# Patient Record
Sex: Female | Born: 1965 | Race: White | Hispanic: No | Smoking: Current every day smoker
Health system: Southern US, Community
[De-identification: ages and names within clinical notes are randomized; demographics above are authoritative.]

## PROBLEM LIST (undated history)

## (undated) DIAGNOSIS — R519 Headache, unspecified: Secondary | ICD-10-CM

## (undated) DIAGNOSIS — R112 Nausea with vomiting, unspecified: Secondary | ICD-10-CM

## (undated) DIAGNOSIS — R51 Headache: Secondary | ICD-10-CM

## (undated) DIAGNOSIS — Z9889 Other specified postprocedural states: Secondary | ICD-10-CM

## (undated) DIAGNOSIS — N921 Excessive and frequent menstruation with irregular cycle: Secondary | ICD-10-CM

## (undated) DIAGNOSIS — T7840XA Allergy, unspecified, initial encounter: Secondary | ICD-10-CM

## (undated) DIAGNOSIS — D649 Anemia, unspecified: Secondary | ICD-10-CM

## (undated) HISTORY — DX: Anemia, unspecified: D64.9

## (undated) HISTORY — PX: ABDOMINAL HYSTERECTOMY: SHX81

## (undated) HISTORY — DX: Allergy, unspecified, initial encounter: T78.40XA

## (undated) HISTORY — PX: TUBAL LIGATION: SHX77

---

## 1998-02-01 ENCOUNTER — Other Ambulatory Visit: Admission: RE | Admit: 1998-02-01 | Discharge: 1998-02-01 | Payer: Self-pay | Admitting: Internal Medicine

## 1999-07-19 ENCOUNTER — Encounter (HOSPITAL_BASED_OUTPATIENT_CLINIC_OR_DEPARTMENT_OTHER): Payer: Self-pay | Admitting: Internal Medicine

## 1999-07-19 ENCOUNTER — Ambulatory Visit (HOSPITAL_COMMUNITY): Admission: RE | Admit: 1999-07-19 | Discharge: 1999-07-19 | Payer: Self-pay | Admitting: Internal Medicine

## 2001-08-29 ENCOUNTER — Emergency Department (HOSPITAL_COMMUNITY): Admission: EM | Admit: 2001-08-29 | Discharge: 2001-08-29 | Payer: Self-pay | Admitting: Emergency Medicine

## 2001-08-29 ENCOUNTER — Encounter: Payer: Self-pay | Admitting: Emergency Medicine

## 2001-09-10 ENCOUNTER — Encounter: Admission: RE | Admit: 2001-09-10 | Discharge: 2001-09-10 | Payer: Self-pay | Admitting: Orthopedic Surgery

## 2002-04-27 ENCOUNTER — Encounter: Admission: RE | Admit: 2002-04-27 | Discharge: 2002-04-27 | Payer: Self-pay | Admitting: Internal Medicine

## 2002-04-27 ENCOUNTER — Encounter (HOSPITAL_BASED_OUTPATIENT_CLINIC_OR_DEPARTMENT_OTHER): Payer: Self-pay | Admitting: Internal Medicine

## 2004-05-18 ENCOUNTER — Emergency Department (HOSPITAL_COMMUNITY): Admission: EM | Admit: 2004-05-18 | Discharge: 2004-05-18 | Payer: Self-pay | Admitting: Family Medicine

## 2005-03-15 ENCOUNTER — Emergency Department (HOSPITAL_COMMUNITY): Admission: EM | Admit: 2005-03-15 | Discharge: 2005-03-15 | Payer: Self-pay | Admitting: Family Medicine

## 2005-08-12 ENCOUNTER — Emergency Department (HOSPITAL_COMMUNITY): Admission: EM | Admit: 2005-08-12 | Discharge: 2005-08-12 | Payer: Self-pay | Admitting: Family Medicine

## 2009-04-26 ENCOUNTER — Inpatient Hospital Stay (HOSPITAL_COMMUNITY): Admission: AD | Admit: 2009-04-26 | Discharge: 2009-04-26 | Payer: Self-pay | Admitting: Family Medicine

## 2009-05-23 ENCOUNTER — Encounter: Payer: Self-pay | Admitting: Obstetrics & Gynecology

## 2009-05-23 ENCOUNTER — Ambulatory Visit: Payer: Self-pay | Admitting: Obstetrics and Gynecology

## 2009-05-23 LAB — CONVERTED CEMR LAB
Chlamydia, Swab/Urine, PCR: NEGATIVE
GC Probe Amp, Urine: NEGATIVE
TSH: 1.51 microintl units/mL (ref 0.350–4.500)

## 2009-06-06 ENCOUNTER — Ambulatory Visit: Payer: Self-pay | Admitting: Obstetrics and Gynecology

## 2009-06-20 ENCOUNTER — Ambulatory Visit (HOSPITAL_COMMUNITY): Admission: RE | Admit: 2009-06-20 | Discharge: 2009-06-20 | Payer: Self-pay | Admitting: Obstetrics and Gynecology

## 2009-06-29 ENCOUNTER — Ambulatory Visit: Payer: Self-pay | Admitting: Obstetrics & Gynecology

## 2010-02-05 ENCOUNTER — Emergency Department (HOSPITAL_COMMUNITY): Admission: EM | Admit: 2010-02-05 | Discharge: 2010-02-05 | Payer: Self-pay | Admitting: Family Medicine

## 2010-05-10 IMAGING — US US TRANSVAGINAL NON-OB
1 series · 14 of 25 positions shown · non-contrast
Comparison: 04/26/2009

CLINICAL DATA: Fibroids.  Follow-up complex left ovarian cystic
lesion.

TRANSVAGINAL ULTRASOUND OF PELVIS
TECHNIQUE: Transvaginal ultrasound examination of the pelvis was
performed including evaluation of the uterus, ovaries, adnexal
regions, and pelvic cul-de-sac.

[Series 1: us transvaginal non-ob · 32 acquisitions, 14 frames shown]
[im 1/32]
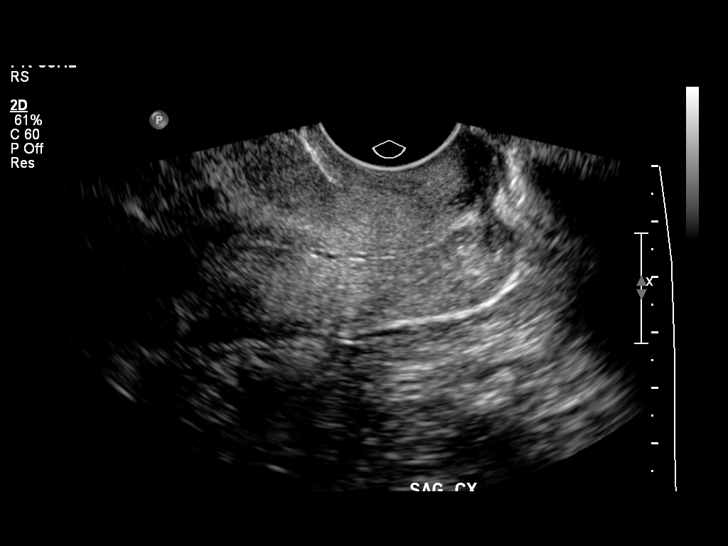
[im 3/32]
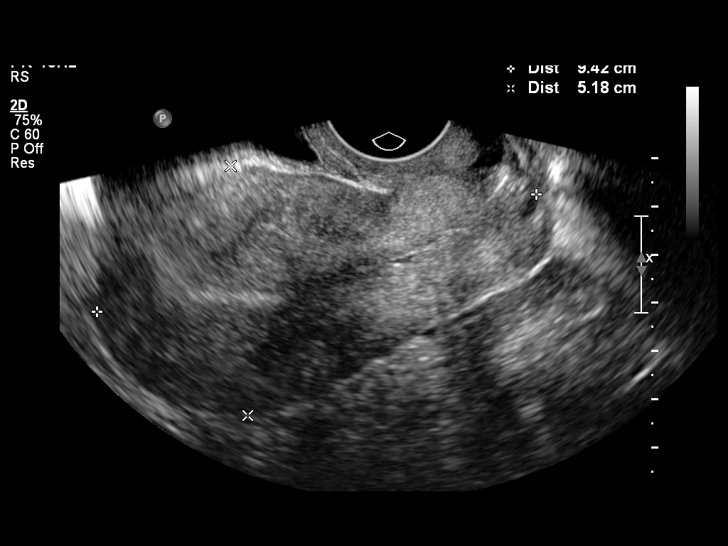
[im 6/32]
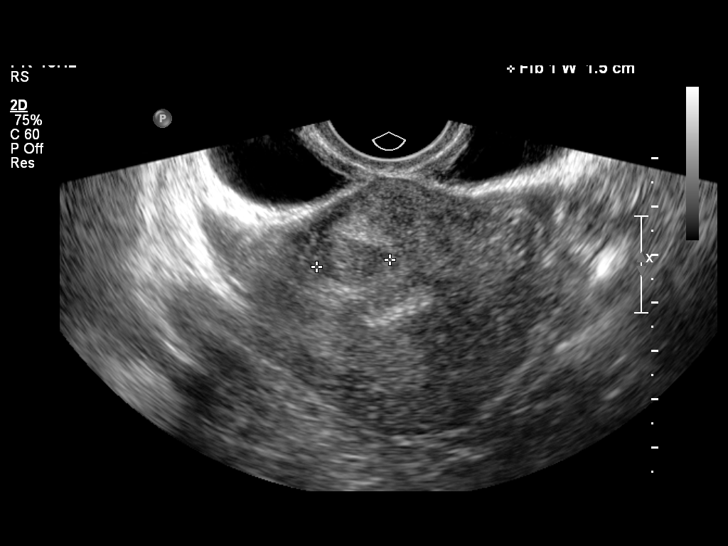
[im 8/32]
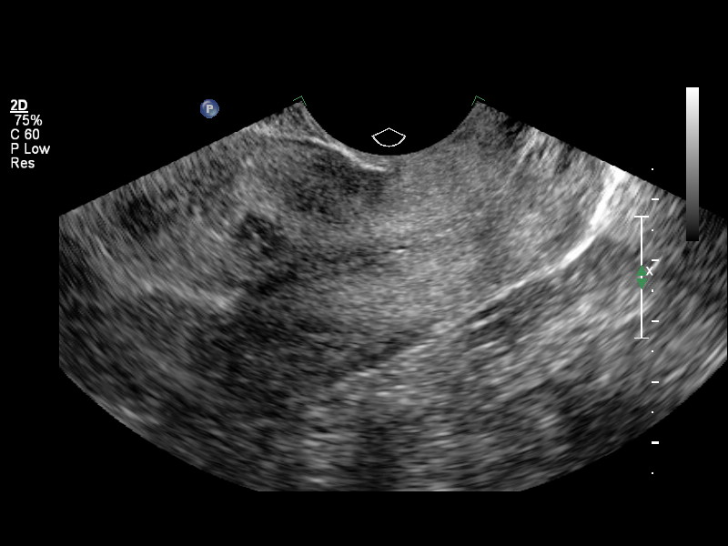
[im 11/32]
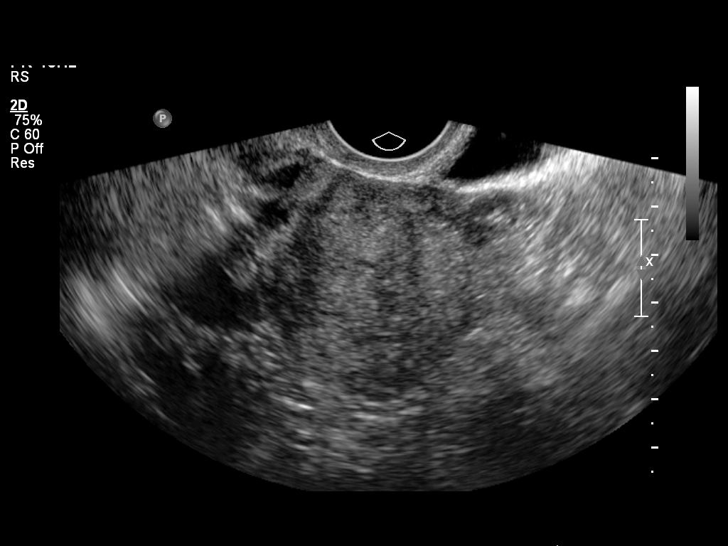
[im 12/32]
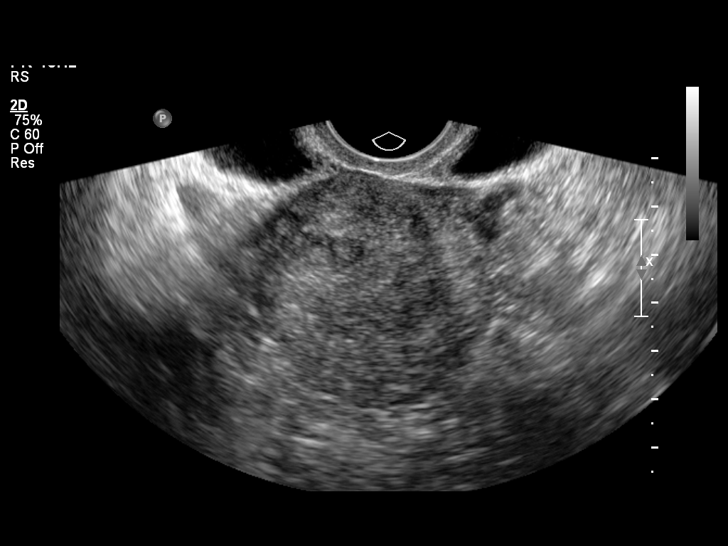
[im 15/32]
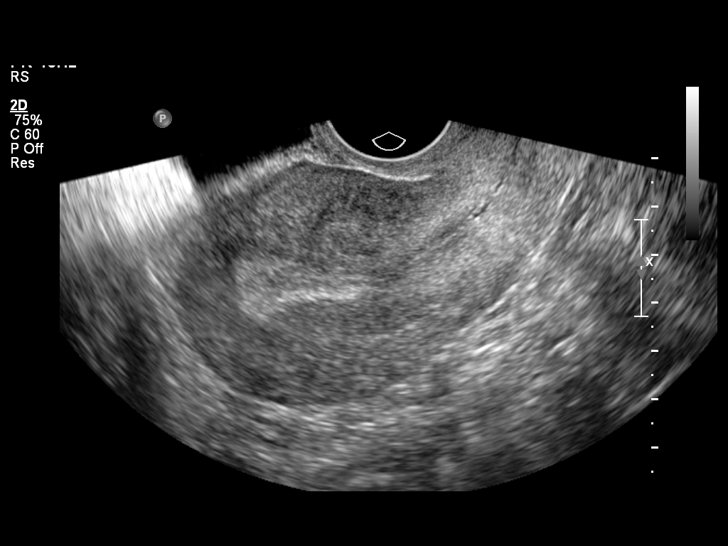
[im 17/32]
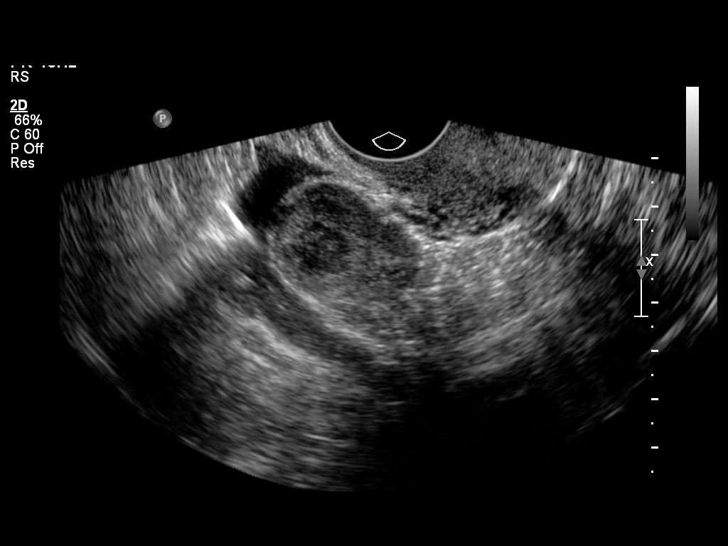
[im 20/32]
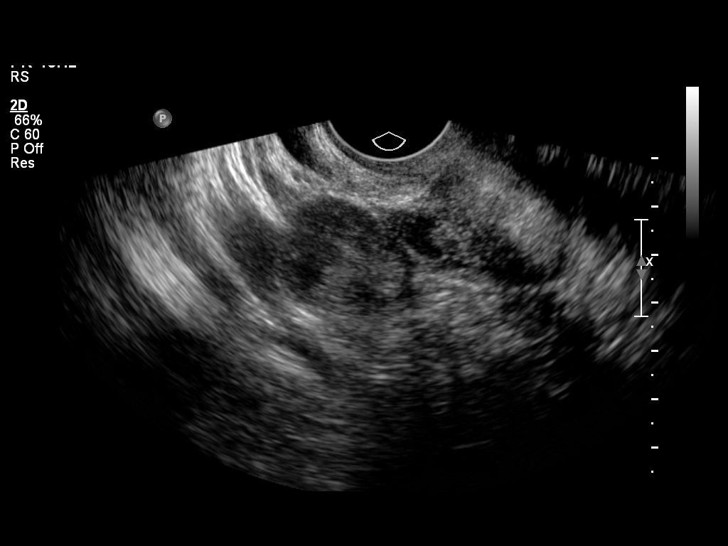
[im 21/32]
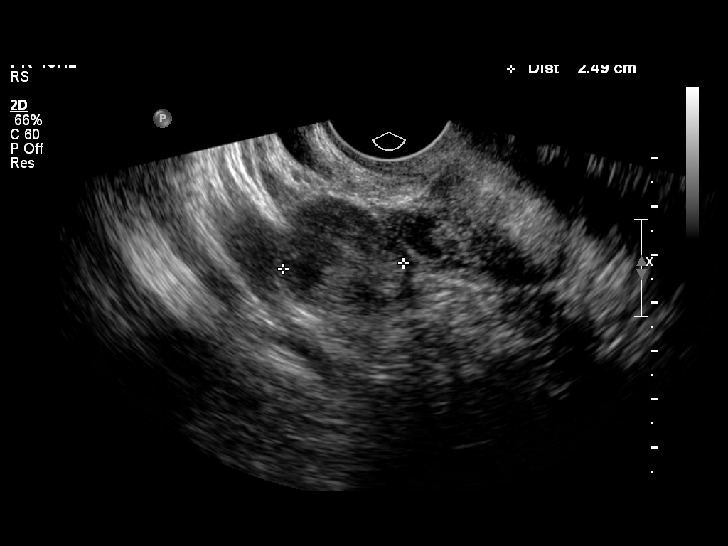
[im 24/32]
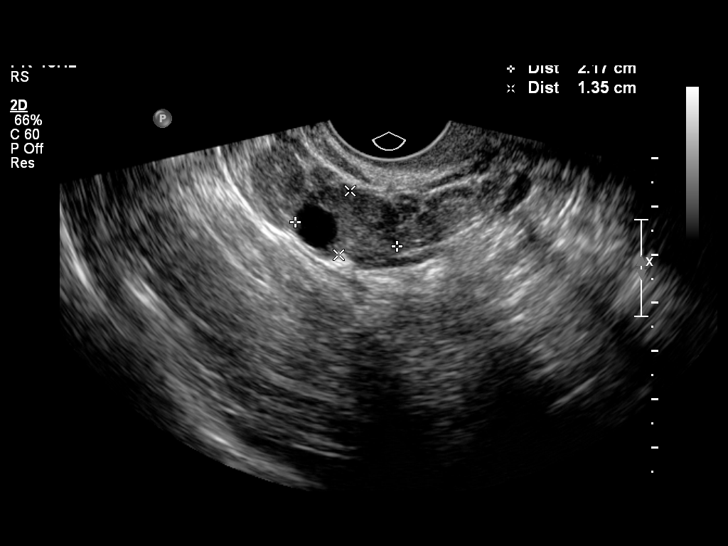
[im 26/32]
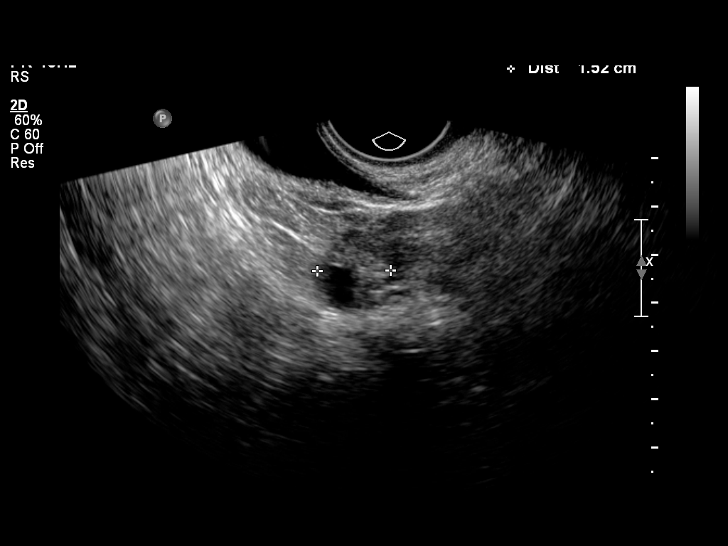
[im 29/32]
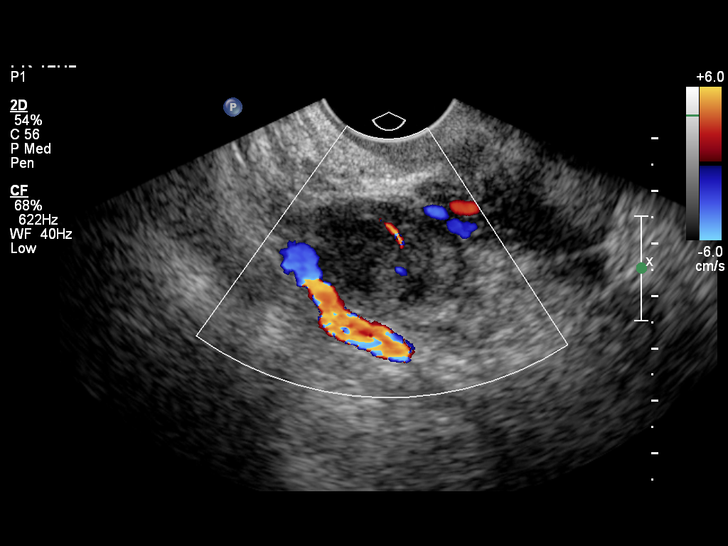
[im 32/32]
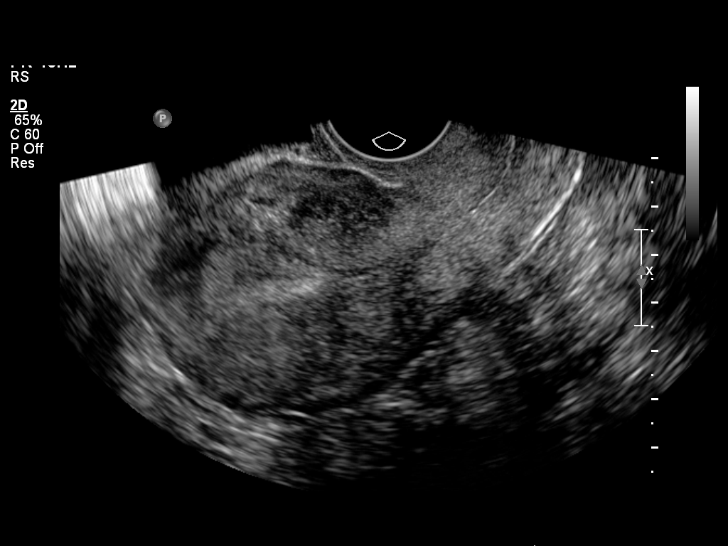

[14 of 25 positions shown; findings below may reference images not displayed]

FINDINGS: Uterus measures 9.4 x 5.2 x 5.7 cm.  Small fibroid is again seen
in the right anterior uterine body measuring 2 cm in maximum
diameter.

Endometrium measures 9 mm in thickness.  Within normal limits in
appearance.

Right Ovary measures 3.1 x 3.2 x 2.5 cm.  Normal appearance.

Left Ovary measures 2.2 x 1.4 x 1.5 cm.  Normal appearance.
Previously seen hemorrhagic cyst has resolved since prior study.

Other Findings:  No adnexal mass or free fluid identified.
IMPRESSION: 1.  Resolution of left ovarian hemorrhagic cyst since prior study.
2.  Stable 2 cm right anterior uterine fibroid.

## 2010-05-31 ENCOUNTER — Ambulatory Visit: Payer: Self-pay | Admitting: Advanced Practice Midwife

## 2010-06-04 ENCOUNTER — Ambulatory Visit (HOSPITAL_COMMUNITY)
Admission: RE | Admit: 2010-06-04 | Discharge: 2010-06-04 | Payer: Self-pay | Source: Home / Self Care | Admitting: Family Medicine

## 2010-06-21 ENCOUNTER — Ambulatory Visit: Payer: Self-pay | Admitting: Obstetrics & Gynecology

## 2010-10-17 LAB — CBC: Platelets: 222 10*3/uL (ref 150–400)

## 2010-10-17 LAB — WET PREP, GENITAL
Trich, Wet Prep: NONE SEEN
Yeast Wet Prep HPF POC: NONE SEEN

## 2010-10-17 LAB — HCG, SERUM, QUALITATIVE: Preg, Serum: NEGATIVE

## 2010-12-17 ENCOUNTER — Other Ambulatory Visit: Payer: Self-pay | Admitting: Obstetrics & Gynecology

## 2010-12-17 DIAGNOSIS — Z1231 Encounter for screening mammogram for malignant neoplasm of breast: Secondary | ICD-10-CM

## 2010-12-18 ENCOUNTER — Ambulatory Visit (HOSPITAL_COMMUNITY): Payer: Self-pay

## 2010-12-30 ENCOUNTER — Ambulatory Visit (HOSPITAL_COMMUNITY)
Admission: RE | Admit: 2010-12-30 | Discharge: 2010-12-30 | Disposition: A | Discharge status: Home / Self Care | Payer: Self-pay | Source: Ambulatory Visit | Attending: Obstetrics & Gynecology | Admitting: Obstetrics & Gynecology

## 2010-12-30 DIAGNOSIS — Z1231 Encounter for screening mammogram for malignant neoplasm of breast: Secondary | ICD-10-CM | POA: Insufficient documentation

## 2012-11-11 ENCOUNTER — Encounter: Payer: BC Managed Care – PPO | Admitting: Family Medicine

## 2013-02-02 NOTE — Progress Notes (Signed)
This encounter was created in error - please disregard.

## 2013-03-19 ENCOUNTER — Ambulatory Visit (INDEPENDENT_AMBULATORY_CARE_PROVIDER_SITE_OTHER): Payer: BC Managed Care – PPO | Admitting: Family Medicine

## 2013-03-19 VITALS — BP 112/72 | HR 69 | Temp 98.1°F | Resp 18 | Ht 65.0 in | Wt 142.0 lb

## 2013-03-19 DIAGNOSIS — L049 Acute lymphadenitis, unspecified: Secondary | ICD-10-CM

## 2013-03-19 MED ORDER — DOXYCYCLINE HYCLATE 100 MG PO CAPS: 100.0000 mg | ORAL_CAPSULE | Freq: Two times a day (BID) | ORAL | Status: DC

## 2013-03-19 NOTE — Progress Notes (Signed)
  Subjective:    Patient ID: Veronica Day, female    DOB: 29-Apr-1966, 47 y.o.   MRN: [id number]   Chief Complaint  Patient presents with  . swollen lymphnode left axilla    x 24 hours   HPI   Woke up with a painful lump under left arm, red but not draining anything.  Very tender. Nothing like this prior.  Past Medical History  Diagnosis Date  . Allergy   . Anemia    Current Outpatient Prescriptions on File Prior to Visit  Medication Sig Dispense Refill  . diphenhydrAMINE (BENADRYL) 25 MG tablet Take 25 mg by mouth every 6 (six) hours as needed for itching.      . Multiple Vitamins-Minerals (MULTIVITAMIN WITH MINERALS) tablet Take 1 tablet by mouth daily.       No current facility-administered medications on file prior to visit.   Allergies  Allergen Reactions  . Codeine Nausea And Vomiting   Review of Systems  Constitutional: Negative for fever, chills, diaphoresis and activity change.  Musculoskeletal: Positive for myalgias. Negative for joint swelling and gait problem.  Skin: Positive for color change. Negative for pallor and wound.  Hematological: Positive for adenopathy. Does not bruise/bleed easily.  Psychiatric/Behavioral: Negative for sleep disturbance.      BP 112/72  Pulse 69  Temp(Src) 98.1 F (36.7 C) (Oral)  Resp 18  Ht 5\' 5"  (1.651 m)  Wt 142 lb (64.411 kg)  BMI 23.63 kg/m2  SpO2 98%  LMP 03/05/2013 Objective:   Physical Exam  Nursing note reviewed. Constitutional: She is oriented to person, place, and time. She appears well-developed and well-nourished. No distress.  HENT:  Head: Normocephalic and atraumatic.  Right Ear: External ear normal.  Eyes: Conjunctivae are normal. No scleral icterus.  Pulmonary/Chest: Effort normal.  Lymphadenopathy:    She has no cervical adenopathy.    She has axillary adenopathy.       Right axillary: No pectoral and no lateral adenopathy present.       Left axillary: Pectoral adenopathy present. No lateral  adenopathy present.      Right: No supraclavicular adenopathy present.       Left: No supraclavicular adenopathy present.  1 cm erythematous tender indurated nodule with tiny central area of fluctuance in center of left axilla  Neurological: She is alert and oriented to person, place, and time.  Skin: Skin is warm and dry. She is not diaphoretic. No erythema.  Psychiatric: She has a normal mood and affect. Her behavior is normal.      Assessment & Plan:  Lymphadenitis, acute Unsure if swollen lymph node vs developing boil or infected sebacous cyst - but not big enough to drain at this time.  Use warm compresses freq and start doxy. If continues to enlarge, RTC for drainage. Meds ordered this encounter  Medications  . doxycycline (VIBRAMYCIN) 100 MG capsule    Sig: Take 1 capsule (100 mg total) by mouth 2 (two) times daily.    Dispense:  20 capsule    Refill:  0

## 2013-04-22 ENCOUNTER — Other Ambulatory Visit: Payer: Self-pay | Admitting: Obstetrics & Gynecology

## 2013-04-27 ENCOUNTER — Encounter (HOSPITAL_COMMUNITY): Payer: Self-pay | Admitting: Pharmacist

## 2013-05-11 ENCOUNTER — Encounter (HOSPITAL_COMMUNITY)
Admission: RE | Admit: 2013-05-11 | Discharge: 2013-05-11 | Disposition: A | Discharge status: Home / Self Care | Payer: BC Managed Care – PPO | Source: Ambulatory Visit | Attending: Obstetrics & Gynecology | Admitting: Obstetrics & Gynecology

## 2013-05-11 ENCOUNTER — Encounter (INDEPENDENT_AMBULATORY_CARE_PROVIDER_SITE_OTHER): Payer: Self-pay

## 2013-05-11 ENCOUNTER — Encounter (HOSPITAL_COMMUNITY): Payer: Self-pay

## 2013-05-11 DIAGNOSIS — N949 Unspecified condition associated with female genital organs and menstrual cycle: Secondary | ICD-10-CM | POA: Diagnosis present

## 2013-05-11 DIAGNOSIS — N8300 Follicular cyst of ovary, unspecified side: Secondary | ICD-10-CM | POA: Diagnosis not present

## 2013-05-11 LAB — CBC
MCH: 31.3 pg (ref 26.0–34.0)
Platelets: 194 10*3/uL (ref 150–400)
RBC: 4.16 MIL/uL (ref 3.87–5.11)

## 2013-05-11 MED ORDER — LACTATED RINGERS IV SOLN: INTRAVENOUS | Status: DC

## 2013-05-11 NOTE — Patient Instructions (Signed)
Your procedure is scheduled on:05/12/13  Enter through the Main Entrance at :6am Pick up desk phone and dial [phone] and inform us of your arrival.  Please call [phone] if you have any problems the morning of surgery.  Remember: Do not eat food or drink liquids, including water, after midnight:tonight   You may brush your teeth the morning of surgery.   DO NOT wear jewelry, eye make-up, lipstick,body lotion, or dark fingernail polish.  (Polished toes are ok) You may wear deodorant.   Patients discharged on the day of surgery will not be allowed to drive home. Wear loose fitting, comfortable clothes for your ride home.

## 2013-05-12 ENCOUNTER — Ambulatory Visit (HOSPITAL_COMMUNITY)
Admission: RE | Admit: 2013-05-12 | Discharge: 2013-05-12 | Disposition: A | Discharge status: Home / Self Care | Payer: BC Managed Care – PPO | Source: Ambulatory Visit | Attending: Obstetrics & Gynecology | Admitting: Obstetrics & Gynecology

## 2013-05-12 ENCOUNTER — Encounter (HOSPITAL_COMMUNITY): Payer: Self-pay | Admitting: Certified Registered"

## 2013-05-12 ENCOUNTER — Encounter (HOSPITAL_COMMUNITY): Payer: BC Managed Care – PPO | Admitting: Anesthesiology

## 2013-05-12 ENCOUNTER — Ambulatory Visit (HOSPITAL_COMMUNITY): Payer: BC Managed Care – PPO | Admitting: Anesthesiology

## 2013-05-12 ENCOUNTER — Encounter (HOSPITAL_COMMUNITY)
Admission: RE | Disposition: A | Discharge status: Home / Self Care | Payer: Self-pay | Source: Ambulatory Visit | Attending: Obstetrics & Gynecology

## 2013-05-12 DIAGNOSIS — N8300 Follicular cyst of ovary, unspecified side: Secondary | ICD-10-CM | POA: Insufficient documentation

## 2013-05-12 DIAGNOSIS — N949 Unspecified condition associated with female genital organs and menstrual cycle: Secondary | ICD-10-CM | POA: Diagnosis not present

## 2013-05-12 HISTORY — PX: LAPAROSCOPY: SHX197

## 2013-05-12 LAB — PREGNANCY, URINE: Preg Test, Ur: NEGATIVE

## 2013-05-12 SURGERY — LAPAROSCOPY, DIAGNOSTIC
Anesthesia: General | Site: Abdomen | Wound class: Clean

## 2013-05-12 MED ORDER — KETOROLAC TROMETHAMINE 30 MG/ML IJ SOLN
INTRAMUSCULAR | Status: AC
  Filled 2013-05-12: qty 1

## 2013-05-12 MED ORDER — NEOSTIGMINE METHYLSULFATE 1 MG/ML IJ SOLN
INTRAMUSCULAR | Status: DC | PRN
  Administered 2013-05-12: 3 mg via INTRAVENOUS

## 2013-05-12 MED ORDER — MIDAZOLAM HCL 2 MG/2ML IJ SOLN
INTRAMUSCULAR | Status: AC
  Filled 2013-05-12: qty 2

## 2013-05-12 MED ORDER — BUPIVACAINE HCL (PF) 0.25 % IJ SOLN
INTRAMUSCULAR | Status: DC | PRN
  Administered 2013-05-12: 10 mL

## 2013-05-12 MED ORDER — SILVER NITRATE-POT NITRATE 75-25 % EX MISC
CUTANEOUS | Status: AC
  Filled 2013-05-12: qty 1

## 2013-05-12 MED ORDER — LIDOCAINE HCL (CARDIAC) 20 MG/ML IV SOLN
INTRAVENOUS | Status: AC
  Filled 2013-05-12: qty 5

## 2013-05-12 MED ORDER — DEXAMETHASONE SODIUM PHOSPHATE 10 MG/ML IJ SOLN
INTRAMUSCULAR | Status: DC | PRN
  Administered 2013-05-12: 10 mg via INTRAVENOUS

## 2013-05-12 MED ORDER — PROPOFOL 10 MG/ML IV BOLUS
INTRAVENOUS | Status: DC | PRN
  Administered 2013-05-12: 150 mg via INTRAVENOUS
  Administered 2013-05-12: 30 mg via INTRAVENOUS
  Administered 2013-05-12: 20 mg via INTRAVENOUS

## 2013-05-12 MED ORDER — FENTANYL CITRATE 0.05 MG/ML IJ SOLN
INTRAMUSCULAR | Status: DC | PRN
  Administered 2013-05-12 (×4): 50 ug via INTRAVENOUS

## 2013-05-12 MED ORDER — FENTANYL CITRATE 0.05 MG/ML IJ SOLN
INTRAMUSCULAR | Status: AC
  Administered 2013-05-12: 50 ug via INTRAVENOUS
  Filled 2013-05-12: qty 2

## 2013-05-12 MED ORDER — ROCURONIUM BROMIDE 100 MG/10ML IV SOLN
INTRAVENOUS | Status: DC | PRN
  Administered 2013-05-12: 30 mg via INTRAVENOUS

## 2013-05-12 MED ORDER — LACTATED RINGERS IV SOLN
INTRAVENOUS | Status: DC
  Administered 2013-05-12 (×2): via INTRAVENOUS

## 2013-05-12 MED ORDER — DEXAMETHASONE SODIUM PHOSPHATE 10 MG/ML IJ SOLN
INTRAMUSCULAR | Status: AC
  Filled 2013-05-12: qty 1

## 2013-05-12 MED ORDER — METHYLENE BLUE 1 % INJ SOLN
INTRAMUSCULAR | Status: AC
  Filled 2013-05-12: qty 10

## 2013-05-12 MED ORDER — GLYCOPYRROLATE 0.2 MG/ML IJ SOLN
INTRAMUSCULAR | Status: DC | PRN
  Administered 2013-05-12: .5 mg via INTRAVENOUS

## 2013-05-12 MED ORDER — ONDANSETRON HCL 4 MG/2ML IJ SOLN
INTRAMUSCULAR | Status: DC | PRN
  Administered 2013-05-12: 4 mg via INTRAVENOUS

## 2013-05-12 MED ORDER — CEFAZOLIN SODIUM-DEXTROSE 2-3 GM-% IV SOLR
INTRAVENOUS | Status: AC
  Filled 2013-05-12: qty 50

## 2013-05-12 MED ORDER — BUPIVACAINE HCL (PF) 0.25 % IJ SOLN
INTRAMUSCULAR | Status: AC
  Filled 2013-05-12: qty 30

## 2013-05-12 MED ORDER — ONDANSETRON HCL 4 MG/2ML IJ SOLN
INTRAMUSCULAR | Status: AC
  Filled 2013-05-12: qty 2

## 2013-05-12 MED ORDER — PROPOFOL 10 MG/ML IV EMUL
INTRAVENOUS | Status: AC
  Filled 2013-05-12: qty 20

## 2013-05-12 MED ORDER — PROMETHAZINE HCL 25 MG/ML IJ SOLN
INTRAMUSCULAR | Status: AC
  Administered 2013-05-12: 6.25 mg
  Filled 2013-05-12: qty 1

## 2013-05-12 MED ORDER — HEPARIN SODIUM (PORCINE) 5000 UNIT/ML IJ SOLN
INTRAMUSCULAR | Status: AC
  Filled 2013-05-12: qty 1

## 2013-05-12 MED ORDER — MIDAZOLAM HCL 2 MG/2ML IJ SOLN
INTRAMUSCULAR | Status: DC | PRN
  Administered 2013-05-12: 2 mg via INTRAVENOUS

## 2013-05-12 MED ORDER — PROMETHAZINE HCL 25 MG PO TABS: 25.0000 mg | ORAL_TABLET | Freq: Four times a day (QID) | ORAL | Status: DC | PRN

## 2013-05-12 MED ORDER — LIDOCAINE HCL (CARDIAC) 20 MG/ML IV SOLN
INTRAVENOUS | Status: DC | PRN
  Administered 2013-05-12: 80 mg via INTRAVENOUS

## 2013-05-12 MED ORDER — FENTANYL CITRATE 0.05 MG/ML IJ SOLN
INTRAMUSCULAR | Status: AC
  Administered 2013-05-12: 25 ug via INTRAVENOUS
  Filled 2013-05-12: qty 2

## 2013-05-12 MED ORDER — FENTANYL CITRATE 0.05 MG/ML IJ SOLN
INTRAMUSCULAR | Status: AC
  Filled 2013-05-12: qty 5

## 2013-05-12 MED ORDER — CEFAZOLIN SODIUM-DEXTROSE 2-3 GM-% IV SOLR
INTRAVENOUS | Status: DC | PRN
  Administered 2013-05-12: 2 g via INTRAVENOUS

## 2013-05-12 MED ORDER — KETOROLAC TROMETHAMINE 30 MG/ML IJ SOLN
INTRAMUSCULAR | Status: DC | PRN
  Administered 2013-05-12: 30 mg via INTRAVENOUS

## 2013-05-12 MED ORDER — NEOSTIGMINE METHYLSULFATE 1 MG/ML IJ SOLN
INTRAMUSCULAR | Status: AC
  Filled 2013-05-12: qty 1

## 2013-05-12 MED ORDER — ROCURONIUM BROMIDE 100 MG/10ML IV SOLN
INTRAVENOUS | Status: AC
  Filled 2013-05-12: qty 1

## 2013-05-12 MED ORDER — GLYCOPYRROLATE 0.2 MG/ML IJ SOLN
INTRAMUSCULAR | Status: AC
  Filled 2013-05-12: qty 3

## 2013-05-12 MED ORDER — HYDROCODONE-IBUPROFEN 5-200 MG PO TABS: 1.0000 | ORAL_TABLET | Freq: Three times a day (TID) | ORAL | Status: DC | PRN

## 2013-05-12 MED ORDER — FENTANYL CITRATE 0.05 MG/ML IJ SOLN
25.0000 ug | INTRAMUSCULAR | Status: DC | PRN
  Administered 2013-05-12: 50 ug via INTRAVENOUS
  Administered 2013-05-12: 25 ug via INTRAVENOUS
  Administered 2013-05-12: 50 ug via INTRAVENOUS
  Administered 2013-05-12: 25 ug via INTRAVENOUS

## 2013-05-12 SURGICAL SUPPLY — 27 items
ADH SKN CLS APL DERMABOND .7 (GAUZE/BANDAGES/DRESSINGS) ×1
APPLICATOR COTTON TIP 6IN STRL (MISCELLANEOUS) ×2 IMPLANT
CABLE HIGH FREQUENCY MONO STRZ (ELECTRODE) IMPLANT
CATH ROBINSON RED A/P 16FR (CATHETERS) ×1 IMPLANT
CLOTH BEACON ORANGE TIMEOUT ST (SAFETY) ×2 IMPLANT
DERMABOND ADVANCED (GAUZE/BANDAGES/DRESSINGS) ×1
DERMABOND ADVANCED .7 DNX12 (GAUZE/BANDAGES/DRESSINGS) ×1 IMPLANT
GLOVE BIO SURGEON STRL SZ 6.5 (GLOVE) ×2 IMPLANT
GLOVE BIOGEL PI IND STRL 7.0 (GLOVE) ×1 IMPLANT
GLOVE BIOGEL PI INDICATOR 7.0 (GLOVE) ×1
GOWN PREVENTION PLUS LG XLONG (DISPOSABLE) ×6 IMPLANT
IV STOPCOCK 4 WAY 40  W/Y SET (IV SOLUTION)
IV STOPCOCK 4 WAY 40 W/Y SET (IV SOLUTION) IMPLANT
MANIPULATOR UTERINE 4.5 ZUMI (MISCELLANEOUS) IMPLANT
PACK LAPAROSCOPY BASIN (CUSTOM PROCEDURE TRAY) ×2 IMPLANT
PENCIL BUTTON HOLSTER BLD 10FT (ELECTRODE) ×1 IMPLANT
PROTECTOR NERVE ULNAR (MISCELLANEOUS) ×2 IMPLANT
SET IRRIG TUBING LAPAROSCOPIC (IRRIGATION / IRRIGATOR) IMPLANT
SUT MNCRL AB 4-0 PS2 18 (SUTURE) ×4 IMPLANT
SUT VICRYL 0 UR6 27IN ABS (SUTURE) ×4 IMPLANT
TOWEL OR 17X24 6PK STRL BLUE (TOWEL DISPOSABLE) ×4 IMPLANT
TRAY FOLEY CATH 14FR (SET/KITS/TRAYS/PACK) ×2 IMPLANT
TROCAR BALLN 12MMX100 BLUNT (TROCAR) ×2 IMPLANT
TROCAR XCEL NON-BLD 11X100MML (ENDOMECHANICALS) IMPLANT
TROCAR XCEL NON-BLD 5MMX100MML (ENDOMECHANICALS) ×2 IMPLANT
TROCAR XCEL OPT SLVE 5M 100M (ENDOMECHANICALS) IMPLANT
WATER STERILE IRR 1000ML POUR (IV SOLUTION) ×3 IMPLANT

## 2013-05-12 NOTE — H&P (Signed)
Veronica Day is an 47 y.o. female G49P3A2  RP:  Persistent pelvic pain for Dx LPS  Pertinent Gynecological History: Menses: flow is moderate Contraception: BT/S Blood transfusions: none Sexually transmitted diseases: no past history Previous GYN Procedures: BT/S  Last mammogram: normal Last pap: normal  OB History:  G5P3A2   Menstrual History:  No LMP recorded.    Past Medical History  Diagnosis Date  . Allergy   . Anemia     h/o with pregnancy    Past Surgical History  Procedure Laterality Date  . Tubal ligation      Family History  Problem Relation Age of Onset  . Thyroid disease Mother   . Heart disease Father   . Thyroid disease Sister   . Hypertension Brother     Social History:  reports that she has been smoking Cigarettes.  She has been smoking about 1.00 pack per day. She uses smokeless tobacco. She reports that she drinks alcohol. She reports that she does not use illicit drugs.  Allergies:  Allergies  Allergen Reactions  . Codeine Nausea And Vomiting    Prescriptions prior to admission  Medication Sig Dispense Refill  . acetaminophen (TYLENOL) 500 MG tablet Take 500 mg by mouth 2 (two) times daily as needed for pain.      . Biotin 1000 MCG tablet Take 1,000 mcg by mouth daily.      . Cholecalciferol 10000 UNITS CAPS Take 1 capsule by mouth daily.      . diphenhydrAMINE (BENADRYL) 25 MG tablet Take 25 mg by mouth daily as needed for allergies.       . Multiple Vitamins-Minerals (MULTIVITAMIN WITH MINERALS) tablet Take 1 tablet by mouth daily.        ROS  Blood pressure 106/85, pulse 79, temperature 98.2 F (36.8 C), temperature source Oral, resp. rate 18, SpO2 98.00%. 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  Assessment/Plan: Persistent pelvic pain for Dx LPS.  Surgery and risks reviewed.  Rashod Gougeon,MARIE-LYNE 05/12/2013, 7:19 AM

## 2013-05-12 NOTE — Anesthesia Postprocedure Evaluation (Signed)
Anesthesia Post Note  Patient: Veronica Day  Procedure(s) Performed: Procedure(s) (LRB): LAPAROSCOPY DIAGNOSTIC, BIOPSY OF PERITONEUM, DRAINAGE OF RIGHT FOLLICULAR CYST (N/A)  Anesthesia type: General  Patient location: PACU  Post pain: Pain level controlled  Post assessment: Post-op Vital signs reviewed  Last Vitals:  Filed Vitals:   05/12/13 0909  BP:   Pulse: 47  Temp:   Resp: 12    Post vital signs: Reviewed  Level of consciousness: sedated  Complications: No apparent anesthesia complications

## 2013-05-12 NOTE — Op Note (Signed)
05/12/2013  8:31 AM  PATIENT:  Veronica Day  47 y.o. female  PRE-OPERATIVE DIAGNOSIS:  Persistent Pelvic Pain  [address]  POST-OPERATIVE DIAGNOSIS:  Persistent Pelvic Pain  PROCEDURE:  Procedure(s): LAPAROSCOPY DIAGNOSTIC, BIOPSY OF PERITONEUM, DRAINAGE OF RIGHT FOLLICULAR CYST  SURGEON:  Surgeon(s): Genia Del, MD  ASSISTANTS: none   ANESTHESIA:   general  PROCEDURE:  Under general anesthesia with endotracheal intubation the patient is in lithotomy position. She is prepped with ChloraPrep on the abdomen and with Betadine on the suprapubic, vulvar and vaginal areas. She is draped as usual. The Foley is inserted in the bladder. The speculum is inserted in the vagina and the uterus was cannulated with a 1 tooth cannula. The tenaculum was removed and the speculum is removed. We go to the abdomen we infiltrate the subcutaneous tissue at the infraumbilically is with Marcaine one quarter plain 8 cc. We make and a 1.5 cm incision with a scalpel. On the aponeurosis is in opened with Mayo scissors under direct vision and the peritoneum is opened bluntly with the finger. A pursestring stitch of Vicryl 0 is done on the aponeurosis. The Roseanne Reno is inserted at that level and a pneumoperitoneum is created with CO2. The camera is inserted at that level. A the 5 mm incision is done at the left pelvic area after infiltrating Marcaine one quarter plain 2 cc. We insert the 5 mm trochars under direct vision. We explored the abdominopelvic cavities. The abdomen is normal with a normal liver and normal appendix. Pictures are taken. 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 Both tubes are status post tubal sterilization. The left ovary is normal to inspection in size and appearance. The right ovary is normal in size and appearance with a small follicular cyst. No lesion of endometriosis is seen in the pelvis on the anterior and posterior  cul-de-sac as well as the ovarian fossae are well the inspected. Pictures are taken of all the pelvic anatomy. We note a small retraction of the peritoneum at the right uterosacral ligament on the external side no lesion of endometriosis is seen side deep in that retraction. Nonetheless a biopsy of the peritoneum is taken at that location. We also drain the right follicular cyst on the ovary. Hemostasis is adequate at all levels. We therefore removed the laparoscopic instrument. We removed the trochars under direct vision. The CO2 is evacuated. The infraumbilical incision is closed by attaching the pursestring stitch at the aponeurosis. We put a cuticular stitch of Vicryl 4-0 on both incisions. We had Dermabond on both incisions. The intrauterine cannula was removed.  The Foley was removed from the bladder. The patient was brought to recovery room in good and stable status.  ESTIMATED BLOOD LOSS:  5 cc   Intake/Output Summary (Last 24 hours) at 05/12/13 0831 Last data filed at 05/12/13 0819  Gross per 24 hour  Intake      0 ml  Output    120 ml  Net   -120 ml     BLOOD ADMINISTERED:none   LOCAL MEDICATIONS USED:  MARCAINE     SPECIMEN:  Source of Specimen:  Peritoneum right cul-de-sac close to right Utero-Sacral ligament   DISPOSITION OF SPECIMEN:  PATHOLOGY  COUNTS:  YES  PLAN OF CARE: Transfer to PACU  Genia Del MD  05/12/2013 at 8:31 am

## 2013-05-12 NOTE — Transfer of Care (Signed)
Immediate Anesthesia Transfer of Care Note  Patient: Veronica Day  Procedure(s) Performed: Procedure(s): LAPAROSCOPY DIAGNOSTIC, BIOPSY OF PERITONEUM, DRAINAGE OF RIGHT FOLLICULAR CYST (N/A)  Patient Location: PACU  Anesthesia Type:General  Level of Consciousness: awake, alert  and oriented  Airway & Oxygen Therapy: Patient Spontanous Breathing and Patient connected to nasal cannula oxygen  Post-op Assessment: Report given to PACU RN, Post -op Vital signs reviewed and stable and Patient moving all extremities  Post vital signs: Reviewed and stable  Complications: No apparent anesthesia complications

## 2013-05-12 NOTE — Discharge Summary (Signed)
  Physician Discharge Summary  Patient ID: Veronica Day MRN: [id number] DOB/AGE: 1966/03/21 48 y.o.  Admit date: 05/12/2013 Discharge date: 05/12/2013  Admission Diagnoses: Persistent Pelvic Pain  40981  Discharge Diagnoses: Persistent Pelvic Pain  19147        Active Problems:   * No active hospital problems. *   Discharged Condition: good  Hospital Course: Outpatient  Consults: None  Treatments: surgery: Dx Laparoscopy, biopsy of peritoneum, drainage of right ovarian follicle.  Disposition: Final discharge disposition not confirmed     Medication List         acetaminophen 500 MG tablet  Commonly known as:  TYLENOL  Take 500 mg by mouth 2 (two) times daily as needed for pain.     Biotin 1000 MCG tablet  Take 1,000 mcg by mouth daily.     Cholecalciferol 10000 UNITS Caps  Take 1 capsule by mouth daily.     diphenhydrAMINE 25 MG tablet  Commonly known as:  BENADRYL  Take 25 mg by mouth daily as needed for allergies.     hydrocodone-ibuprofen 5-200 MG per tablet  Commonly known as:  VICOPROFEN  Take 1 tablet by mouth every 8 (eight) hours as needed for pain.     multivitamin with minerals tablet  Take 1 tablet by mouth daily.     promethazine 25 MG tablet  Commonly known as:  PHENERGAN  Take 1 tablet (25 mg total) by mouth every 6 (six) hours as needed for nausea.           Follow-up Information   Follow up with Aalayah Riles,MARIE-LYNE, MD In 3 weeks.   Specialty:  Obstetrics and Gynecology   Contact information:   [address] [place] [address] [phone]       Signed: Genia Del, MD 05/12/2013, 8:53 AM

## 2013-05-12 NOTE — Preoperative (Signed)
Beta Blockers   Reason not to administer Beta Blockers:Not Applicable 

## 2013-05-12 NOTE — Anesthesia Preprocedure Evaluation (Addendum)
Anesthesia Evaluation  Patient identified by MRN, date of birth, ID band Patient awake    Reviewed: Allergy & Precautions, H&P , Patient's Chart, lab work & pertinent test results, reviewed documented beta blocker date and time   Airway Mallampati: II TM Distance: >3 FB Neck ROM: full    Dental no notable dental hx.    Pulmonary  breath sounds clear to auscultation  Pulmonary exam normal       Cardiovascular Rhythm:regular Rate:Normal     Neuro/Psych    GI/Hepatic   Endo/Other    Renal/GU      Musculoskeletal   Abdominal   Peds  Hematology  (+) anemia ,   Anesthesia Other Findings   Reproductive/Obstetrics                          Anesthesia Physical Anesthesia Plan  ASA: II  Anesthesia Plan: General ETT   Post-op Pain Management:    Induction:   Airway Management Planned:   Additional Equipment:   Intra-op Plan:   Post-operative Plan:   Informed Consent: I have reviewed the patients History and Physical, chart, labs and discussed the procedure including the risks, benefits and alternatives for the proposed anesthesia with the patient or authorized representative who has indicated his/her understanding and acceptance.   Dental Advisory Given  Plan Discussed with: CRNA and Surgeon  Anesthesia Plan Comments: (Labs checked- platelets confirmed with RN in room. Fetal heart tracing, per RN, reported to be stable enough for sitting procedure. Discussed epidural, and patient consents to the procedure:  included risk of possible headache,backache, failed block, allergic reaction, and nerve injury. This patient was asked if she had any questions or concerns before the procedure started. )       Anesthesia Quick Evaluation

## 2013-05-13 ENCOUNTER — Encounter (HOSPITAL_COMMUNITY): Payer: Self-pay | Admitting: Obstetrics & Gynecology

## 2013-06-17 ENCOUNTER — Ambulatory Visit (INDEPENDENT_AMBULATORY_CARE_PROVIDER_SITE_OTHER): Payer: BC Managed Care – PPO | Admitting: Physician Assistant

## 2013-06-17 VITALS — BP 116/68 | HR 61 | Temp 98.1°F | Resp 14 | Ht 63.0 in | Wt 146.0 lb

## 2013-06-17 DIAGNOSIS — J020 Streptococcal pharyngitis: Secondary | ICD-10-CM

## 2013-06-17 DIAGNOSIS — J029 Acute pharyngitis, unspecified: Secondary | ICD-10-CM

## 2013-06-17 LAB — POCT RAPID STREP A (OFFICE): Rapid Strep A Screen: POSITIVE — AB

## 2013-06-17 MED ORDER — AMOXICILLIN 875 MG PO TABS: 875.0000 mg | ORAL_TABLET | Freq: Two times a day (BID) | ORAL | Status: DC

## 2013-06-17 NOTE — Progress Notes (Signed)
   Subjective:    Patient ID: Veronica Day, female    DOB: April 08, 1966, 47 y.o.   MRN: [id number]  HPI Pt presents to clinic with 4 day h/o worsening sore throat.  She has some myalgias and today developed a little sniffles but she does not feel like she has a sinus cold.    OTC - Nyquil and Dayquil -  Minimal relief Works with the public at [employer] Review of Systems  Constitutional: Positive for chills. Negative for fever.  HENT: Positive for sore throat. Negative for congestion and rhinorrhea.   Respiratory: Positive for cough (mild).   Gastrointestinal: Negative for nausea, vomiting and diarrhea.  Musculoskeletal: Positive for myalgias.  Neurological: Positive for headaches.       Objective:   Physical Exam  Vitals reviewed. Constitutional: She is oriented to person, place, and time. She appears well-developed and well-nourished.  HENT:  Head: Normocephalic and atraumatic.  Right Ear: Hearing, tympanic membrane, external ear and ear canal normal.  Left Ear: Hearing, tympanic membrane, external ear and ear canal normal.  Nose: Mucosal edema (red) present.  Mouth/Throat: Posterior oropharyngeal edema (uvula is edematous) and posterior oropharyngeal erythema (mild) present. No oropharyngeal exudate.  Eyes: Conjunctivae are normal.  Neck: Normal range of motion.  Cardiovascular: Normal rate, regular rhythm and normal heart sounds.   No murmur heard. Pulmonary/Chest: Effort normal. She has no wheezes.  Lymphadenopathy:    She has cervical adenopathy (R>L AC enlargement and TTP).  Neurological: She is alert and oriented to person, place, and time.  Skin: Skin is warm and dry.  Psychiatric: She has a normal mood and affect. Her behavior is normal. Judgment and thought content normal.   Results for orders placed in visit on 06/17/13  POCT RAPID STREP A (OFFICE)      Result Value Range   Rapid Strep A Screen Positive (*) Negative       Assessment & Plan:  Sore throat -  Plan: POCT rapid strep A  Streptococcal sore throat - Plan: amoxicillin (AMOXIL) 875 MG tablet    Benny Lennert PA-C 06/17/2013 5:33 PM

## 2013-11-30 ENCOUNTER — Other Ambulatory Visit: Payer: Self-pay | Admitting: Dermatology

## 2014-02-23 ENCOUNTER — Ambulatory Visit (INDEPENDENT_AMBULATORY_CARE_PROVIDER_SITE_OTHER): Payer: BC Managed Care – PPO | Admitting: Family Medicine

## 2014-02-23 VITALS — BP 124/70 | HR 61 | Temp 98.1°F | Resp 16 | Ht 63.0 in | Wt 144.2 lb

## 2014-02-23 DIAGNOSIS — H109 Unspecified conjunctivitis: Secondary | ICD-10-CM

## 2014-02-23 MED ORDER — OFLOXACIN 0.3 % OP SOLN: 2.0000 [drp] | Freq: Four times a day (QID) | OPHTHALMIC | Status: DC

## 2014-02-23 NOTE — Patient Instructions (Signed)

## 2014-02-23 NOTE — Progress Notes (Addendum)
Subjective:   This chart was scribed for Wardell Honour, MD by Forrestine Him, [facility] Scribe. This patient was seen in room 4 and the patient's care was started 8:19 PM.    Patient ID: Veronica Day, female    DOB: 1965-10-09, 48 y.o.   MRN: [id number]  02/23/2014  Eye Irritation   HPI  HPI Comments: Veronica Day is a 48 y.o. female who presents to [facility] complaining of constant, moderate L eye redness onset 2:30 PM today. Pt also reports eye discharge, mild photophobia, and discomfort with blinking.  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 Pt states she used some OTC Visine after onset of symptoms this afternoon which helped alleviate some of the redness to the eye. At this time she denies any fever, chills, diaphoresis, otalgia, rhinorrhea, sore throat, or cough. Pt does not currently where contacts. No history of chronic illness. Pt with known allergy to Codeine. No other concerns this visit.  No trauma to eye; no FB to eye that pt is aware.  She is not followed by a PCP at this time.  Review of Systems  HENT: Negative for congestion, ear pain, postnasal drip, rhinorrhea, sneezing and sore throat.   Eyes: Positive for photophobia, pain, discharge, redness and itching.    Past Medical History  Diagnosis Date  . Allergy   . Anemia     h/o with pregnancy   Past Surgical History  Procedure Laterality Date  . Tubal ligation    . Laparoscopy N/A 05/12/2013    Procedure: LAPAROSCOPY DIAGNOSTIC, BIOPSY OF PERITONEUM, DRAINAGE OF RIGHT FOLLICULAR CYST;  Surgeon: Princess Bruins, MD;  Location: [facility];  Service: Gynecology;  Laterality: N/A;   Allergies  Allergen Reactions  . Codeine Nausea And Vomiting   Current Outpatient Prescriptions  Medication Sig Dispense Refill  . Cholecalciferol 10000 UNITS CAPS Take 1 capsule by mouth daily.      .  ferrous sulfate 325 (65 FE) MG tablet Take 325 mg by mouth daily with breakfast.      . Multiple Vitamins-Minerals (MULTIVITAMIN WITH MINERALS) tablet Take 1 tablet by mouth daily.      Marland Kitchen ofloxacin (OCUFLOX) 0.3 % ophthalmic solution Place 2 drops into the left eye 4 (four) times daily.  10 mL  0   No current facility-administered medications for this visit.   History   Social History  . Marital Status: Divorced    Spouse Name: N/A    Number of Children: N/A  . Years of Education: N/A   Occupational History  . Not on file.   Social History Main Topics  . Smoking status: Current Every Day Smoker -- 1.00 packs/day    Types: Cigarettes  . Smokeless tobacco: Current User  . Alcohol Use: Yes     Comment: occasionally  . Drug Use: No  . Sexual Activity: Yes   Other Topics Concern  . Not on file   Social History Narrative  . No narrative on file       Objective:    BP 124/70  Pulse 61  Temp(Src) 98.1 F (36.7 C) (Oral)  Resp 16  Ht 5\' 3"  (1.6 m)  Wt 144 lb 3.2 oz (65.409 kg)  BMI 25.55 kg/m2  SpO2 98%  LMP 02/15/2014 Physical Exam  Nursing note and vitals reviewed. Constitutional: She is oriented to person, place, and time. She appears  well-developed and well-nourished. No distress.  HENT:  Head: Normocephalic and atraumatic.  Right Ear: External ear normal.  Left Ear: External ear normal.  Nose: Nose normal.  Mouth/Throat: Oropharynx is clear and moist. No oropharyngeal exudate.  Eyes: EOM are normal. Pupils are equal, round, and reactive to light. Right eye exhibits no chemosis, no discharge, no exudate and no hordeolum. No foreign body present in the right eye. Left eye exhibits discharge. Left eye exhibits no chemosis, no exudate and no hordeolum. No foreign body present in the left eye. Right conjunctiva is not injected. Right conjunctiva has no hemorrhage. Left conjunctiva is injected. Left conjunctiva has no hemorrhage.  Slit lamp exam:      The left eye shows  no corneal abrasion, no corneal ulcer, no foreign body, no hyphema, no fluorescein uptake and no anterior chamber bulge.  Scant yellow drainage to the L eye  Neck: Normal range of motion.  Pulmonary/Chest: Effort normal.  Abdominal: She exhibits no distension.  Musculoskeletal: Normal range of motion.  Neurological: She is alert and oriented to person, place, and time.  Skin: She is not diaphoretic.  Psychiatric: She has a normal mood and affect.     Assessment & Plan:   1. Conjunctivitis, left eye    1. L eye conjunctivitis:  New.  rx for Ocuflox drops provided.  RTC for acute worsening or lack of improvement.  Meds ordered this encounter  Medications  . ferrous sulfate 325 (65 FE) MG tablet    Sig: Take 325 mg by mouth daily with breakfast.  . ofloxacin (OCUFLOX) 0.3 % ophthalmic solution    Sig: Place 2 drops into the left eye 4 (four) times daily.    Dispense:  10 mL    Refill:  0    No Follow-up on file.   I personally performed the services described in this documentation, which was scribed in my presence. The recorded information has been reviewed and is accurate.   Reginia Forts, M.D.  [facility] [address] [place], [place]  [address] [phone] phone [phone] fax

## 2014-04-03 ENCOUNTER — Inpatient Hospital Stay (HOSPITAL_COMMUNITY)
Admission: AD | Admit: 2014-04-03 | Discharge: 2014-04-03 | Disposition: A | Discharge status: Home / Self Care | Payer: BC Managed Care – PPO | Source: Ambulatory Visit | Attending: Obstetrics & Gynecology | Admitting: Obstetrics & Gynecology

## 2014-04-03 ENCOUNTER — Inpatient Hospital Stay (HOSPITAL_COMMUNITY): Payer: BC Managed Care – PPO

## 2014-04-03 ENCOUNTER — Encounter (HOSPITAL_COMMUNITY): Payer: Self-pay | Admitting: Obstetrics and Gynecology

## 2014-04-03 DIAGNOSIS — N949 Unspecified condition associated with female genital organs and menstrual cycle: Secondary | ICD-10-CM | POA: Diagnosis present

## 2014-04-03 DIAGNOSIS — N938 Other specified abnormal uterine and vaginal bleeding: Secondary | ICD-10-CM | POA: Diagnosis present

## 2014-04-03 DIAGNOSIS — D259 Leiomyoma of uterus, unspecified: Secondary | ICD-10-CM | POA: Diagnosis not present

## 2014-04-03 DIAGNOSIS — N921 Excessive and frequent menstruation with irregular cycle: Secondary | ICD-10-CM

## 2014-04-03 DIAGNOSIS — F17200 Nicotine dependence, unspecified, uncomplicated: Secondary | ICD-10-CM | POA: Diagnosis not present

## 2014-04-03 DIAGNOSIS — N920 Excessive and frequent menstruation with regular cycle: Secondary | ICD-10-CM | POA: Insufficient documentation

## 2014-04-03 DIAGNOSIS — D252 Subserosal leiomyoma of uterus: Secondary | ICD-10-CM

## 2014-04-03 HISTORY — DX: Excessive and frequent menstruation with irregular cycle: N92.1

## 2014-04-03 LAB — CBC
HCT: 39.6 % (ref 36.0–46.0)
Hemoglobin: 13.8 g/dL (ref 12.0–15.0)
MCH: 31.7 pg (ref 26.0–34.0)
MCHC: 34.8 g/dL (ref 30.0–36.0)
MCV: 91 fL (ref 78.0–100.0)
Platelets: 229 10*3/uL (ref 150–400)
RBC: 4.35 MIL/uL (ref 3.87–5.11)
RDW: 12.3 % (ref 11.5–15.5)
WBC: 8.8 10*3/uL (ref 4.0–10.5)

## 2014-04-03 LAB — URINALYSIS, ROUTINE W REFLEX MICROSCOPIC
Bilirubin Urine: NEGATIVE
GLUCOSE, UA: NEGATIVE mg/dL
Ketones, ur: NEGATIVE mg/dL
Leukocytes, UA: NEGATIVE
Nitrite: NEGATIVE
PROTEIN: NEGATIVE mg/dL
Specific Gravity, Urine: <1.005 — ABNORMAL LOW (ref 1.005–1.030)
Urobilinogen, UA: 0.2 mg/dL (ref 0.0–1.0)
pH: 6 (ref 5.0–8.0)

## 2014-04-03 LAB — URINE MICROSCOPIC-ADD ON

## 2014-04-03 LAB — POCT PREGNANCY, URINE: PREG TEST UR: NEGATIVE

## 2014-04-03 MED ORDER — IBUPROFEN 800 MG PO TABS: 800.0000 mg | ORAL_TABLET | Freq: Three times a day (TID) | ORAL | Status: DC | PRN

## 2014-04-03 MED ORDER — NORETHINDRONE ACETATE 5 MG PO TABS: 5.0000 mg | ORAL_TABLET | Freq: Every day | ORAL | Status: DC

## 2014-04-03 NOTE — MAU Note (Signed)
Patient states that for about 3 weeks she was having a clumpy yellow vaginal discharge that stopped, States she started having bleeding after intercourse on 9-18. States she passed a clot yesterday am and has been having heavy bleeding and passing clots that are dark and thick. Has been having left lower abdominal pain for about one week.

## 2014-04-03 NOTE — MAU Provider Note (Signed)
History     CSN: [id number]  Arrival date and time: 04/03/14 1324 Orders placed in EPIC: [place] Provider at bedside: [place]     Chief Complaint  Patient presents with  . Vaginal Bleeding   HPI  Ms. Veronica Day is a 48 yo female presenting today with complaints of LMP and heavy vaginal bleeding since 9/18.  The bleeding is heavy enough that she has been changing super plus tampon every 45 minutes. She reports starting to pass quarter sized blood clots yesterday.  Today while she was at work she passed a blood clot the size of "the palm of (her) hand".  She immediately came to hospital to be checked out.  She last had SI 2 wks and reports postcoital bleeding then, too.  She has had a sharp pain in the LLQ area that is "similar to when (she) had ovarian cyst last year".  She is asymptomatic.  This menstrual cycle is the first that she has had in 6 wks.  She did not call the office to be seen for this complaint today.  Her primary GYN provider is Dr. Dellis Filbert.  Past Medical History  Diagnosis Date  . Allergy   . Anemia     h/o with pregnancy    Past Surgical History  Procedure Laterality Date  . Tubal ligation    . Laparoscopy N/A 05/12/2013    Procedure: LAPAROSCOPY DIAGNOSTIC, BIOPSY OF PERITONEUM, DRAINAGE OF RIGHT FOLLICULAR CYST;  Surgeon: Princess Bruins, MD;  Location: [facility];  Service: Gynecology;  Laterality: N/A;    Family History  Problem Relation Age of Onset  . Thyroid disease Mother   . Heart disease Father   . Thyroid disease Sister   . Hypertension Brother     History  Substance Use Topics  . Smoking status: Current Every Day Smoker -- 1.00 packs/day    Types: Cigarettes  . Smokeless tobacco: Current User  . Alcohol Use: Yes     Comment: occasionally    Allergies:  Allergies  Allergen Reactions  . Codeine Nausea And Vomiting    Prescriptions prior to admission  Medication Sig Dispense Refill  . Cholecalciferol 10000 UNITS CAPS Take 1 capsule by  mouth daily.      . ferrous sulfate 325 (65 FE) MG tablet Take 325 mg by mouth daily with breakfast.      . Multiple Vitamins-Minerals (MULTIVITAMIN WITH MINERALS) tablet Take 1 tablet by mouth daily.      Marland Kitchen ofloxacin (OCUFLOX) 0.3 % ophthalmic solution Place 2 drops into the left eye 4 (four) times daily.  10 mL  0    Review of Systems  Constitutional: Negative.   HENT: Negative.   Eyes: Negative.   Respiratory: Negative.   Cardiovascular: Negative.   Gastrointestinal: Negative.   Genitourinary:       Heavy vaginal bleeding with clots since yesterday; LLQ sharp pain  Musculoskeletal: Negative.   Skin: Negative.   Neurological: Negative.   Endo/Heme/Allergies: Negative.   Psychiatric/Behavioral: Negative.    Results for orders placed during the hospital encounter of 04/03/14 (from the past 24 hour(s))  URINALYSIS, ROUTINE W REFLEX MICROSCOPIC     Status: Abnormal   Collection Time    04/03/14  2:05 PM      Result Value Ref Range   Color, Urine YELLOW  YELLOW   APPearance CLEAR  CLEAR   Specific Gravity, Urine <1.005 (*) 1.005 - 1.030   pH 6.0  5.0 - 8.0   Glucose, UA NEGATIVE  NEGATIVE mg/dL   Hgb urine dipstick LARGE (*) NEGATIVE   Bilirubin Urine NEGATIVE  NEGATIVE   Ketones, ur NEGATIVE  NEGATIVE mg/dL   Protein, ur NEGATIVE  NEGATIVE mg/dL   Urobilinogen, UA 0.2  0.0 - 1.0 mg/dL   Nitrite NEGATIVE  NEGATIVE   Leukocytes, UA NEGATIVE  NEGATIVE  URINE MICROSCOPIC-ADD ON     Status: Abnormal   Collection Time    04/03/14  2:05 PM      Result Value Ref Range   Squamous Epithelial / LPF FEW (*) RARE   RBC / HPF 3-6  <3 RBC/hpf  POCT PREGNANCY, URINE     Status: None   Collection Time    04/03/14  2:16 PM      Result Value Ref Range   Preg Test, Ur NEGATIVE  NEGATIVE  CBC     Status: None   Collection Time    04/03/14  3:27 PM      Result Value Ref Range   WBC 8.8  4.0 - 10.5 K/uL   RBC 4.35  3.87 - 5.11 MIL/uL   Hemoglobin 13.8  12.0 - 15.0 g/dL   HCT 39.6  36.0  - 46.0 %   MCV 91.0  78.0 - 100.0 fL   MCH 31.7  26.0 - 34.0 pg   MCHC 34.8  30.0 - 36.0 g/dL   RDW 12.3  11.5 - 15.5 %   Platelets 229  150 - 400 K/uL   US Pelvis Complete   04/03/2014   CLINICAL DATA:  Heavy bleeding since Friday, getting heavier  EXAM: TRANSABDOMINAL AND TRANSVAGINAL ULTRASOUND OF PELVIS  TECHNIQUE: Both transabdominal and transvaginal ultrasound examinations of the pelvis were performed. Transabdominal technique was performed for global imaging of the pelvis including uterus, ovaries, adnexal regions, and pelvic cul-de-sac. It was necessary to proceed with endovaginal exam following the transabdominal exam to visualize the endometrium.  COMPARISON:  None  FINDINGS: Uterus  Measurements: 10.3 x 6.7 x 6.7 cm. There is an isoechoic myometrial mass arising off of the anterior fundus, measuring 4.8 x 4.3 x 3.5 cm.  Endometrium  Thickness: 8 mm.  No focal abnormality visualized.  Right ovary  Measurements: 4.0 x 3.4 x 2.8 cm. Normal appearance/no adnexal mass.  Left ovary  Measurements: 4.0 x 3.1 x 2.5 cm. Normal appearance/no adnexal mass.  Other findings  No free fluid.  IMPRESSION: 4.8 cm fibroid arising off the uterine fundus. No definite indication that it is subendometrial.   Electronically Signed   By: Skipper Cliche M.D.   On: 04/03/2014 18:17   Physical Exam   Blood pressure 117/76, pulse 63, temperature 98.6 F (37 C), temperature source Oral, resp. rate 16, height 5\' 3"  (1.6 m), weight 64.411 kg (142 lb), last menstrual period 03/31/2014, SpO2 99.00%. Orthostatic Vital Signs: Lying: BP131/71, P84; Sitting: BP121/66, P80; Standing: BP124/76, P87  Physical Exam  Constitutional: She is oriented to person, place, and time. She appears well-developed and well-nourished.  HENT:  Head: Normocephalic and atraumatic.  Eyes: Pupils are equal, round, and reactive to light.  Neck: Normal range of motion. Neck supple.  Cardiovascular: Normal rate, regular rhythm and normal heart  sounds.   Respiratory: Effort normal and breath sounds normal.  GI: Soft. Bowel sounds are normal.  Genitourinary:  Enlarged uterus ~8 wks size bimanually; Mild tenderness in Lt adnexal area; no CMT; no palpable masses; SSE small amount of dark, blood cleared from vagina; no active bleeding noted from cervical os  Musculoskeletal: Normal  range of motion.  Neurological: She is alert and oriented to person, place, and time. She has normal reflexes.  Skin: Skin is warm and dry.  Psychiatric: She has a normal mood and affect. Her behavior is normal. Judgment and thought content normal.    MAU Course  Procedures CBC Orthostatic Vitals Complete Pelvic U/S Assessment and Plan  Menorrhagia Uterine Fibroid  Discharge Home  Menorrhagia & uterine fibroids instructions given Ibuprofen 800 mg every 8 hrs while bleeding Aygestin 5 mg daily x 21 days F/U with Dr. Dellis Filbert in 2 wks  *Dr. Benjie Karvonen notified of assessment & plan / recommend adding Aygestin tx  Laury Deep, M MSN, CNM 04/03/2014, 4:41 PM

## 2014-04-03 NOTE — Discharge Instructions (Signed)
Please CALL THE OFFICE at [phone] for any further problems, questions or concerns. Follow-up with Dr. Dellis Day in 2 weeks.  Menorrhagia Menorrhagia is the medical term for when your menstrual periods are heavy or last longer than usual. With menorrhagia, every period you have may cause enough blood loss and cramping that you are unable to maintain your usual activities. CAUSES  In some cases, the cause of heavy periods is unknown, but a number of conditions may cause menorrhagia. Common causes include:  A problem with the hormone-producing thyroid gland (hypothyroid).  Noncancerous growths in the uterus (polyps or fibroids).  An imbalance of the estrogen and progesterone hormones.  One of your ovaries not releasing an egg during one or more months.  Side effects of having an intrauterine device (IUD).  Side effects of some medicines, such as anti-inflammatory medicines or blood thinners.  A bleeding disorder that stops your blood from clotting normally. SIGNS AND SYMPTOMS  During a normal period, bleeding lasts between 4 and 8 days. Signs that your periods are too heavy include:  You routinely have to change your pad or tampon every 1 or 2 hours because it is completely soaked.  You pass blood clots larger than 1 inch (2.5 cm) in size.  You have bleeding for more than 7 days.  You need to use pads and tampons at the same time because of heavy bleeding.  You need to wake up to change your pads or tampons during the night.  You have symptoms of anemia, such as tiredness, fatigue, or shortness of breath. DIAGNOSIS  Your health care provider will perform a physical exam and ask you questions about your symptoms and menstrual history. Other tests may be ordered based on what the health care provider finds during the exam. These tests can include:  Blood tests. Blood tests are used to check if you are pregnant or have hormonal changes, a bleeding or thyroid disorder, low iron  levels (anemia), or other problems.  Endometrial biopsy. Your health care provider takes a sample of tissue from the inside of your uterus to be examined under a microscope.  Pelvic ultrasound. This test uses sound waves to make a picture of your uterus, ovaries, and vagina. The pictures can show if you have fibroids or other growths.  Hysteroscopy. For this test, your health care provider will use a small telescope to look inside your uterus. Based on the results of your initial tests, your health care provider may recommend further testing. TREATMENT  Treatment may not be needed. If it is needed, your health care provider may recommend treatment with one or more medicines first. If these do not reduce bleeding enough, a surgical treatment might be an option. The best treatment for you will depend on:   Whether you need to prevent pregnancy.  Your desire to have children in the future.  The cause and severity of your bleeding.  Your opinion and personal preference.  Medicines for menorrhagia may include:  Birth control methods that use hormones. These include the pill, skin patch, vaginal ring, shots that you get every 3 months, hormonal IUD, and implant. These treatments reduce bleeding during your menstrual period.  Medicines that thicken blood and slow bleeding.  Medicines that reduce swelling, such as ibuprofen.  Medicines that contain a synthetic hormone called progestin.   Medicines that make the ovaries stop working for a short time.  You may need surgical treatment for menorrhagia if the medicines are unsuccessful. Treatment options include:  Dilation  and curettage (D&C). In this procedure, your health care provider opens (dilates) your cervix and then scrapes or suctions tissue from the lining of your uterus to reduce menstrual bleeding.  Operative hysteroscopy. This procedure uses a tiny tube with a light (hysteroscope) to view your uterine cavity and can help in the  surgical removal of a polyp that may be causing heavy periods.  Endometrial ablation. Through various techniques, your health care provider permanently destroys the entire lining of your uterus (endometrium). After endometrial ablation, most women have little or no menstrual flow. Endometrial ablation reduces your ability to become pregnant.  Endometrial resection. This surgical procedure uses an electrosurgical wire loop to remove the lining of the uterus. This procedure also reduces your ability to become pregnant.  Hysterectomy. Surgical removal of the uterus and cervix is a permanent procedure that stops menstrual periods. Pregnancy is not possible after a hysterectomy. This procedure requires anesthesia and hospitalization. HOME CARE INSTRUCTIONS   Only take over-the-counter or prescription medicines as directed by your health care provider. Take prescribed medicines exactly as directed. Do not change or switch medicines without consulting your health care provider.  Take any prescribed iron pills exactly as directed by your health care provider. Long-term heavy bleeding may result in low iron levels. Iron pills help replace the iron your body lost from heavy bleeding. Iron may cause constipation. If this becomes a problem, increase the bran, fruits, and roughage in your diet.  Do not take aspirin or medicines that contain aspirin 1 week before or during your menstrual period. Aspirin may make the bleeding worse.  If you need to change your sanitary pad or tampon more than once every 2 hours, stay in bed and rest as much as possible until the bleeding stops.  Eat well-balanced meals. Eat foods high in iron. Examples are leafy green vegetables, meat, liver, eggs, and whole grain breads and cereals. Do not try to lose weight until the abnormal bleeding has stopped and your blood iron level is back to normal. SEEK MEDICAL CARE IF:   You soak through a pad or tampon every 1 or 2 hours, and this  happens every time you have a period.  You need to use pads and tampons at the same time because you are bleeding so much.  You need to change your pad or tampon during the night.  You have a period that lasts for more than 8 days.  You pass clots bigger than 1 inch wide.  You have irregular periods that happen more or less often than once a month.  You feel dizzy or faint.  You feel very weak or tired.  You feel short of breath or feel your heart is beating too fast when you exercise.  You have nausea and vomiting or diarrhea while you are taking your medicine.  You have any problems that may be related to the medicine you are taking. SEEK IMMEDIATE MEDICAL CARE IF:   You soak through 4 or more pads or tampons in 2 hours.  You have any bleeding while you are pregnant. MAKE SURE YOU:   Understand these instructions.  Will watch your condition.  Will get help right away if you are not doing well or get worse. Document Released: 06/30/2005 Document Revised: 07/05/2013 Document Reviewed: 12/19/2012 [facility] Patient Information 2015 [place], [place]. This information is not intended to replace advice given to you by your health care provider. Make sure you discuss any questions you have with your health care provider.  Fibroids Fibroids are lumps (tumors) that can occur any place in a woman's body. These lumps are not cancerous. Fibroids vary in size, weight, and where they grow. HOME CARE  Do not take aspirin.  Write down the number of pads or tampons you use during your period. Tell your doctor. This can help determine the best treatment for you. GET HELP RIGHT AWAY IF:  You have pain in your lower belly (abdomen) that is not helped with medicine.  You have cramps that are not helped with medicine.  You have more bleeding between or during your period.  You feel lightheaded or pass out (faint).  Your lower belly pain gets worse. MAKE SURE YOU:  Understand these  instructions.  Will watch your condition.  Will get help right away if you are not doing well or get worse. Document Released: 08/02/2010 Document Revised: 09/22/2011 Document Reviewed: 08/02/2010 [facility] Patient Information 2015 [place], [place]. This information is not intended to replace advice given to you by your health care provider. Make sure you discuss any questions you have with your health care provider.  Abnormal Uterine Bleeding Abnormal uterine bleeding can affect women at various stages in life, including teenagers, women in their reproductive years, pregnant women, and women who have reached menopause. Several kinds of uterine bleeding are considered abnormal, including:  Bleeding or spotting between periods.   Bleeding after sexual intercourse.   Bleeding that is heavier or more than normal.   Periods that last longer than usual.  Bleeding after menopause.  Many cases of abnormal uterine bleeding are minor and simple to treat, while others are more serious. Any type of abnormal bleeding should be evaluated by your health care provider. Treatment will depend on the cause of the bleeding. HOME CARE INSTRUCTIONS Monitor your condition for any changes. The following actions may help to alleviate any discomfort you are experiencing:  Avoid the use of tampons and douches as directed by your health care provider.  Change your pads frequently. You should get regular pelvic exams and Pap tests. Keep all follow-up appointments for diagnostic tests as directed by your health care provider.  SEEK MEDICAL CARE IF:   Your bleeding lasts more than 1 week.   You feel dizzy at times.  SEEK IMMEDIATE MEDICAL CARE IF:   You pass out.   You are changing pads every 15 to 30 minutes.   You have abdominal pain.  You have a fever.   You become sweaty or weak.   You are passing large blood clots from the vagina.   You start to feel nauseous and vomit. MAKE SURE YOU:    Understand these instructions.  Will watch your condition.  Will get help right away if you are not doing well or get worse. Document Released: 06/30/2005 Document Revised: 07/05/2013 Document Reviewed: 01/27/2013 [facility] Patient Information 2015 [place], [place]. This information is not intended to replace advice given to you by your health care provider. Make sure you discuss any questions you have with your health care provider.

## 2014-04-04 NOTE — MAU Provider Note (Signed)
Reviewed and agree with note and plan. V.Egan Sahlin, MD  

## 2014-04-12 ENCOUNTER — Other Ambulatory Visit: Payer: Self-pay | Admitting: Obstetrics & Gynecology

## 2014-04-14 LAB — CYTOLOGY - PAP

## 2014-05-05 ENCOUNTER — Other Ambulatory Visit: Payer: Self-pay | Admitting: Obstetrics & Gynecology

## 2014-05-08 LAB — CYTOLOGY - PAP

## 2014-05-15 ENCOUNTER — Other Ambulatory Visit: Payer: Self-pay | Admitting: Obstetrics & Gynecology

## 2014-05-15 DIAGNOSIS — R928 Other abnormal and inconclusive findings on diagnostic imaging of breast: Secondary | ICD-10-CM

## 2014-05-26 ENCOUNTER — Ambulatory Visit
Admission: RE | Admit: 2014-05-26 | Discharge: 2014-05-26 | Disposition: A | Discharge status: Home / Self Care | Payer: BC Managed Care – PPO | Source: Ambulatory Visit | Attending: Obstetrics & Gynecology | Admitting: Obstetrics & Gynecology

## 2014-05-26 DIAGNOSIS — R928 Other abnormal and inconclusive findings on diagnostic imaging of breast: Secondary | ICD-10-CM

## 2014-07-20 ENCOUNTER — Encounter (HOSPITAL_COMMUNITY): Payer: Self-pay

## 2014-07-20 ENCOUNTER — Encounter (HOSPITAL_COMMUNITY)
Admission: RE | Admit: 2014-07-20 | Discharge: 2014-07-20 | Disposition: A | Discharge status: Home / Self Care | Payer: BLUE CROSS/BLUE SHIELD | Source: Ambulatory Visit | Attending: Obstetrics & Gynecology | Admitting: Obstetrics & Gynecology

## 2014-07-20 DIAGNOSIS — Z01812 Encounter for preprocedural laboratory examination: Secondary | ICD-10-CM | POA: Diagnosis not present

## 2014-07-20 DIAGNOSIS — N921 Excessive and frequent menstruation with irregular cycle: Secondary | ICD-10-CM | POA: Diagnosis not present

## 2014-07-20 HISTORY — DX: Nausea with vomiting, unspecified: R11.2

## 2014-07-20 HISTORY — DX: Headache: R51

## 2014-07-20 HISTORY — DX: Other specified postprocedural states: Z98.890

## 2014-07-20 HISTORY — DX: Headache, unspecified: R51.9

## 2014-07-20 LAB — CBC
HEMATOCRIT: 38.8 % (ref 36.0–46.0)
Hemoglobin: 13.6 g/dL (ref 12.0–15.0)
MCH: 31.9 pg (ref 26.0–34.0)
MCHC: 35.1 g/dL (ref 30.0–36.0)
MCV: 91.1 fL (ref 78.0–100.0)
PLATELETS: 227 10*3/uL (ref 150–400)
RBC: 4.26 MIL/uL (ref 3.87–5.11)
RDW: 12.1 % (ref 11.5–15.5)
WBC: 7.3 10*3/uL (ref 4.0–10.5)

## 2014-07-20 NOTE — Patient Instructions (Addendum)
Your procedure is scheduled on:  Tuesday, August 01, 2014  Enter through the Main Entrance of [facility] at:  12:45 p.m.  Pick up the phone at the desk and dial [phone].  Call this number if you have problems the morning of surgery: [phone].  Remember: Do NOT eat food:  AFTER MIDNIGHT MONDAY Do NOT drink clear liquids after:  AFTER 10:15 A.M. Tuesday MORNING Take these medicines the morning of surgery with a SIP OF WATER:  NONE  Do NOT wear jewelry (body piercing), metal hair clips/bobby pins, make-up, or nail polish. Do NOT wear lotions, powders, or perfumes.  You may wear deoderant. Do NOT shave for 48 hours prior to surgery. Do NOT bring valuables to the hospital. Contacts, dentures, or bridgework may not be worn into surgery. Leave suitcase in car.  After surgery it may be brought to your room.  For patients admitted to the hospital, checkout time is 11:00 AM the day of discharge.

## 2014-07-24 ENCOUNTER — Other Ambulatory Visit: Payer: Self-pay | Admitting: Obstetrics & Gynecology

## 2014-07-31 NOTE — Anesthesia Preprocedure Evaluation (Signed)
Anesthesia Evaluation  Patient identified by MRN, date of birth, ID band Patient awake    Reviewed: Allergy & Precautions, NPO status , Patient's Chart, lab work & pertinent test results  History of Anesthesia Complications (+) PONV and history of anesthetic complications  Airway Mallampati: II  TM Distance: >3 FB Neck ROM: Full    Dental no notable dental hx. (+) Dental Advisory Given   Pulmonary Current Smoker,  breath sounds clear to auscultation  Pulmonary exam normal       Cardiovascular negative cardio ROS  Rhythm:Regular Rate:Normal     Neuro/Psych  Headaches, negative psych ROS   GI/Hepatic negative GI ROS, Neg liver ROS,   Endo/Other  negative endocrine ROS  Renal/GU negative Renal ROS  negative genitourinary   Musculoskeletal negative musculoskeletal ROS (+)   Abdominal   Peds negative pediatric ROS (+)  Hematology  (+) anemia ,   Anesthesia Other Findings   Reproductive/Obstetrics negative OB ROS                             Anesthesia Physical Anesthesia Plan  ASA: II  Anesthesia Plan: General   Post-op Pain Management:    Induction: Intravenous  Airway Management Planned: Oral ETT  Additional Equipment:   Intra-op Plan:   Post-operative Plan: Extubation in OR  Informed Consent: I have reviewed the patients History and Physical, chart, labs and discussed the procedure including the risks, benefits and alternatives for the proposed anesthesia with the patient or authorized representative who has indicated his/her understanding and acceptance.   Dental advisory given  Plan Discussed with: CRNA  Anesthesia Plan Comments:         Anesthesia Quick Evaluation

## 2014-08-01 ENCOUNTER — Observation Stay (HOSPITAL_COMMUNITY)
Admission: RE | Admit: 2014-08-01 | Discharge: 2014-08-02 | Disposition: A | Discharge status: Home / Self Care | Payer: BLUE CROSS/BLUE SHIELD | Source: Ambulatory Visit | Attending: Obstetrics & Gynecology | Admitting: Obstetrics & Gynecology

## 2014-08-01 ENCOUNTER — Ambulatory Visit (HOSPITAL_COMMUNITY): Payer: BLUE CROSS/BLUE SHIELD | Admitting: Anesthesiology

## 2014-08-01 ENCOUNTER — Encounter (HOSPITAL_COMMUNITY)
Admission: RE | Disposition: A | Discharge status: Home / Self Care | Payer: Self-pay | Source: Ambulatory Visit | Attending: Obstetrics & Gynecology

## 2014-08-01 ENCOUNTER — Encounter (HOSPITAL_COMMUNITY): Payer: Self-pay | Admitting: Anesthesiology

## 2014-08-01 DIAGNOSIS — Z885 Allergy status to narcotic agent status: Secondary | ICD-10-CM | POA: Insufficient documentation

## 2014-08-01 DIAGNOSIS — F17210 Nicotine dependence, cigarettes, uncomplicated: Secondary | ICD-10-CM | POA: Diagnosis not present

## 2014-08-01 DIAGNOSIS — Z90710 Acquired absence of both cervix and uterus: Secondary | ICD-10-CM | POA: Diagnosis present

## 2014-08-01 DIAGNOSIS — D259 Leiomyoma of uterus, unspecified: Secondary | ICD-10-CM | POA: Insufficient documentation

## 2014-08-01 DIAGNOSIS — N938 Other specified abnormal uterine and vaginal bleeding: Secondary | ICD-10-CM | POA: Diagnosis not present

## 2014-08-01 DIAGNOSIS — N838 Other noninflammatory disorders of ovary, fallopian tube and broad ligament: Secondary | ICD-10-CM | POA: Insufficient documentation

## 2014-08-01 DIAGNOSIS — G43909 Migraine, unspecified, not intractable, without status migrainosus: Secondary | ICD-10-CM | POA: Insufficient documentation

## 2014-08-01 DIAGNOSIS — D649 Anemia, unspecified: Secondary | ICD-10-CM | POA: Insufficient documentation

## 2014-08-01 HISTORY — PX: CYSTOSCOPY: SHX5120

## 2014-08-01 HISTORY — PX: LAPAROSCOPIC BILATERAL SALPINGO OOPHERECTOMY: SHX5890

## 2014-08-01 HISTORY — PX: LAPAROSCOPIC ASSISTED VAGINAL HYSTERECTOMY: SHX5398

## 2014-08-01 LAB — TYPE AND SCREEN
ABO/RH(D): O NEG
ANTIBODY SCREEN: NEGATIVE

## 2014-08-01 LAB — PREGNANCY, URINE: PREG TEST UR: NEGATIVE

## 2014-08-01 SURGERY — HYSTERECTOMY, VAGINAL, LAPAROSCOPY-ASSISTED
Anesthesia: General | Site: Urethra

## 2014-08-01 MED ORDER — STERILE WATER FOR IRRIGATION IR SOLN
Status: DC | PRN
  Administered 2014-08-01: 1000 mL

## 2014-08-01 MED ORDER — SCOPOLAMINE 1 MG/3DAYS TD PT72
1.0000 | MEDICATED_PATCH | Freq: Once | TRANSDERMAL | Status: DC
  Administered 2014-08-01: 1.5 mg via TRANSDERMAL

## 2014-08-01 MED ORDER — ONDANSETRON HCL 4 MG PO TABS
4.0000 mg | ORAL_TABLET | Freq: Four times a day (QID) | ORAL | Status: DC | PRN
Start: 2014-08-01 — End: 2014-08-02

## 2014-08-01 MED ORDER — ONDANSETRON HCL 4 MG/2ML IJ SOLN: 4.0000 mg | Freq: Once | INTRAMUSCULAR | Status: DC | PRN

## 2014-08-01 MED ORDER — MIDAZOLAM HCL 2 MG/2ML IJ SOLN: 0.5000 mg | Freq: Once | INTRAMUSCULAR | Status: DC | PRN

## 2014-08-01 MED ORDER — LACTATED RINGERS IV SOLN
INTRAVENOUS | Status: DC
  Administered 2014-08-01 (×2): via INTRAVENOUS

## 2014-08-01 MED ORDER — LIDOCAINE-EPINEPHRINE 1 %-1:100000 IJ SOLN
INTRAMUSCULAR | Status: DC | PRN
  Administered 2014-08-01: 26 mL

## 2014-08-01 MED ORDER — NEOSTIGMINE METHYLSULFATE 10 MG/10ML IV SOLN
INTRAVENOUS | Status: AC
  Filled 2014-08-01: qty 1

## 2014-08-01 MED ORDER — KETOROLAC TROMETHAMINE 30 MG/ML IJ SOLN
30.0000 mg | Freq: Four times a day (QID) | INTRAMUSCULAR | Status: DC
  Administered 2014-08-01 – 2014-08-02 (×3): 30 mg via INTRAVENOUS
  Filled 2014-08-01 (×3): qty 1

## 2014-08-01 MED ORDER — ACETAMINOPHEN 325 MG PO TABS: 325.0000 mg | ORAL_TABLET | ORAL | Status: DC | PRN

## 2014-08-01 MED ORDER — FENTANYL CITRATE 0.05 MG/ML IJ SOLN
INTRAMUSCULAR | Status: AC
  Filled 2014-08-01: qty 5

## 2014-08-01 MED ORDER — GLYCOPYRROLATE 0.2 MG/ML IJ SOLN
INTRAMUSCULAR | Status: AC
  Filled 2014-08-01: qty 4

## 2014-08-01 MED ORDER — ONDANSETRON HCL 4 MG/2ML IJ SOLN: 4.0000 mg | Freq: Four times a day (QID) | INTRAMUSCULAR | Status: DC | PRN

## 2014-08-01 MED ORDER — PROMETHAZINE HCL 25 MG/ML IJ SOLN: 6.2500 mg | INTRAMUSCULAR | Status: DC | PRN

## 2014-08-01 MED ORDER — ACETAMINOPHEN 160 MG/5ML PO SOLN: 325.0000 mg | ORAL | Status: DC | PRN

## 2014-08-01 MED ORDER — FENTANYL CITRATE 0.05 MG/ML IJ SOLN
INTRAMUSCULAR | Status: DC | PRN
  Administered 2014-08-01: 50 ug via INTRAVENOUS
  Administered 2014-08-01: 100 ug via INTRAVENOUS
  Administered 2014-08-01 (×2): 50 ug via INTRAVENOUS

## 2014-08-01 MED ORDER — ONDANSETRON HCL 4 MG/2ML IJ SOLN
INTRAMUSCULAR | Status: AC
  Filled 2014-08-01: qty 2

## 2014-08-01 MED ORDER — HYDROMORPHONE HCL 1 MG/ML IJ SOLN: INTRAMUSCULAR | Status: DC | PRN

## 2014-08-01 MED ORDER — MENTHOL 3 MG MT LOZG: 1.0000 | LOZENGE | OROMUCOSAL | Status: DC | PRN

## 2014-08-01 MED ORDER — PNEUMOCOCCAL VAC POLYVALENT 25 MCG/0.5ML IJ INJ
0.5000 mL | INJECTION | INTRAMUSCULAR | Status: AC
  Administered 2014-08-02: 0.5 mL via INTRAMUSCULAR
  Filled 2014-08-01: qty 0.5

## 2014-08-01 MED ORDER — DOCUSATE SODIUM 100 MG PO CAPS: 100.0000 mg | ORAL_CAPSULE | Freq: Two times a day (BID) | ORAL | Status: DC | PRN

## 2014-08-01 MED ORDER — ROCURONIUM BROMIDE 100 MG/10ML IV SOLN
INTRAVENOUS | Status: DC | PRN
  Administered 2014-08-01: 10 mg via INTRAVENOUS
  Administered 2014-08-01: 40 mg via INTRAVENOUS

## 2014-08-01 MED ORDER — PANTOPRAZOLE SODIUM 40 MG IV SOLR
40.0000 mg | Freq: Every day | INTRAVENOUS | Status: DC
  Filled 2014-08-01 (×2): qty 40

## 2014-08-01 MED ORDER — SCOPOLAMINE 1 MG/3DAYS TD PT72
MEDICATED_PATCH | TRANSDERMAL | Status: AC
  Administered 2014-08-01: 1.5 mg via TRANSDERMAL
  Filled 2014-08-01: qty 1

## 2014-08-01 MED ORDER — MEPERIDINE HCL 25 MG/ML IJ SOLN: 6.2500 mg | INTRAMUSCULAR | Status: DC | PRN

## 2014-08-01 MED ORDER — DEXAMETHASONE SODIUM PHOSPHATE 10 MG/ML IJ SOLN
INTRAMUSCULAR | Status: DC | PRN
  Administered 2014-08-01: 10 mg via INTRAVENOUS

## 2014-08-01 MED ORDER — ONDANSETRON HCL 4 MG/2ML IJ SOLN
INTRAMUSCULAR | Status: DC | PRN
Start: 2014-08-01 — End: 2014-08-01
  Administered 2014-08-01: 4 mg via INTRAVENOUS

## 2014-08-01 MED ORDER — KETOROLAC TROMETHAMINE 30 MG/ML IJ SOLN
30.0000 mg | Freq: Once | INTRAMUSCULAR | Status: DC | PRN
Start: 2014-08-01 — End: 2014-08-01

## 2014-08-01 MED ORDER — FENTANYL CITRATE 0.05 MG/ML IJ SOLN: 25.0000 ug | INTRAMUSCULAR | Status: DC | PRN

## 2014-08-01 MED ORDER — DIPHENHYDRAMINE HCL 50 MG/ML IJ SOLN: 25.0000 mg | Freq: Four times a day (QID) | INTRAMUSCULAR | Status: DC | PRN

## 2014-08-01 MED ORDER — MIDAZOLAM HCL 5 MG/5ML IJ SOLN
INTRAMUSCULAR | Status: DC | PRN
  Administered 2014-08-01: 2 mg via INTRAVENOUS

## 2014-08-01 MED ORDER — BUPIVACAINE HCL (PF) 0.25 % IJ SOLN
INTRAMUSCULAR | Status: DC | PRN
Start: 2014-08-01 — End: 2014-08-01
  Administered 2014-08-01: 6 mL

## 2014-08-01 MED ORDER — LIDOCAINE HCL (CARDIAC) 20 MG/ML IV SOLN
INTRAVENOUS | Status: DC | PRN
  Administered 2014-08-01: 40 mg via INTRAVENOUS

## 2014-08-01 MED ORDER — LIDOCAINE HCL (CARDIAC) 20 MG/ML IV SOLN
INTRAVENOUS | Status: AC
  Filled 2014-08-01: qty 5

## 2014-08-01 MED ORDER — PROPOFOL 10 MG/ML IV BOLUS
INTRAVENOUS | Status: AC
  Filled 2014-08-01: qty 20

## 2014-08-01 MED ORDER — CEFAZOLIN SODIUM-DEXTROSE 2-3 GM-% IV SOLR
2.0000 g | Freq: Once | INTRAVENOUS | Status: AC
  Administered 2014-08-01: 2 g via INTRAVENOUS

## 2014-08-01 MED ORDER — KETOROLAC TROMETHAMINE 30 MG/ML IJ SOLN: 30.0000 mg | Freq: Four times a day (QID) | INTRAMUSCULAR | Status: DC

## 2014-08-01 MED ORDER — NEOSTIGMINE METHYLSULFATE 10 MG/10ML IV SOLN
INTRAVENOUS | Status: DC | PRN
  Administered 2014-08-01: 2 mg via INTRAVENOUS

## 2014-08-01 MED ORDER — PROPOFOL 10 MG/ML IV BOLUS
INTRAVENOUS | Status: DC | PRN
  Administered 2014-08-01: 10 mg via INTRAVENOUS
  Administered 2014-08-01: 150 mg via INTRAVENOUS

## 2014-08-01 MED ORDER — CEFAZOLIN SODIUM-DEXTROSE 2-3 GM-% IV SOLR
INTRAVENOUS | Status: AC
  Filled 2014-08-01: qty 50

## 2014-08-01 MED ORDER — SIMETHICONE 80 MG PO CHEW: 80.0000 mg | CHEWABLE_TABLET | Freq: Four times a day (QID) | ORAL | Status: DC | PRN

## 2014-08-01 MED ORDER — LACTATED RINGERS IV SOLN
INTRAVENOUS | Status: DC
  Administered 2014-08-01 – 2014-08-02 (×2): via INTRAVENOUS

## 2014-08-01 MED ORDER — OXYCODONE-ACETAMINOPHEN 5-325 MG PO TABS: 1.0000 | ORAL_TABLET | ORAL | Status: DC | PRN

## 2014-08-01 MED ORDER — IBUPROFEN 800 MG PO TABS: 800.0000 mg | ORAL_TABLET | Freq: Three times a day (TID) | ORAL | Status: DC | PRN

## 2014-08-01 MED ORDER — MIDAZOLAM HCL 2 MG/2ML IJ SOLN
INTRAMUSCULAR | Status: AC
  Filled 2014-08-01: qty 2

## 2014-08-01 MED ORDER — GLYCOPYRROLATE 0.2 MG/ML IJ SOLN
INTRAMUSCULAR | Status: DC | PRN
  Administered 2014-08-01: 0.2 mg via INTRAVENOUS
  Administered 2014-08-01: 0.4 mg via INTRAVENOUS

## 2014-08-01 MED ORDER — DEXAMETHASONE SODIUM PHOSPHATE 10 MG/ML IJ SOLN
INTRAMUSCULAR | Status: AC
  Filled 2014-08-01: qty 1

## 2014-08-01 SURGICAL SUPPLY — 41 items
BLADE SURG 10 STRL SS (BLADE) ×4 IMPLANT
BLADE SURG 11 STRL SS (BLADE) ×8 IMPLANT
CLOTH BEACON ORANGE TIMEOUT ST (SAFETY) ×4 IMPLANT
COVER BACK TABLE 60X90IN (DRAPES) ×4 IMPLANT
COVER LIGHT HANDLE  1/PK (MISCELLANEOUS) ×1
COVER LIGHT HANDLE 1/PK (MISCELLANEOUS) ×3 IMPLANT
DECANTER SPIKE VIAL GLASS SM (MISCELLANEOUS) ×1 IMPLANT
DRESSING OPSITE X SMALL 2X3 (GAUZE/BANDAGES/DRESSINGS) ×1 IMPLANT
DRSG COVADERM PLUS 2X2 (GAUZE/BANDAGES/DRESSINGS) ×8 IMPLANT
DRSG OPSITE POSTOP 3X4 (GAUZE/BANDAGES/DRESSINGS) ×4 IMPLANT
DURAPREP 26ML APPLICATOR (WOUND CARE) ×4 IMPLANT
ELECT REM PT RETURN 9FT ADLT (ELECTROSURGICAL) ×4
ELECTRODE REM PT RTRN 9FT ADLT (ELECTROSURGICAL) ×3 IMPLANT
EVACUATOR SMOKE 8.L (FILTER) ×4 IMPLANT
GLOVE BIO SURGEON STRL SZ 6.5 (GLOVE) ×8 IMPLANT
GLOVE BIO SURGEON STRL SZ7 (GLOVE) ×8 IMPLANT
GOWN STRL REUS W/ TWL LRG LVL3 (GOWN DISPOSABLE) ×21 IMPLANT
GOWN STRL REUS W/TWL LRG LVL3 (GOWN DISPOSABLE) ×28
LIGASURE 5MM LAPAROSCOPIC (INSTRUMENTS) ×1 IMPLANT
LIQUID BAND (GAUZE/BANDAGES/DRESSINGS) ×1 IMPLANT
NS IRRIG 1000ML POUR BTL (IV SOLUTION) ×4 IMPLANT
PACK LAVH (CUSTOM PROCEDURE TRAY) ×4 IMPLANT
PAD POSITIONER PINK NONSTERILE (MISCELLANEOUS) ×4 IMPLANT
PROTECTOR NERVE ULNAR (MISCELLANEOUS) ×4 IMPLANT
SET CYSTO W/LG BORE CLAMP LF (SET/KITS/TRAYS/PACK) ×4 IMPLANT
SET IRRIG TUBING LAPAROSCOPIC (IRRIGATION / IRRIGATOR) IMPLANT
SLEEVE XCEL OPT CAN 5 100 (ENDOMECHANICALS) ×4 IMPLANT
SUT MON AB 4-0 PS1 27 (SUTURE) ×4 IMPLANT
SUT VIC AB 0 CT1 27 (SUTURE) ×28
SUT VIC AB 0 CT1 27XBRD ANBCTR (SUTURE) ×6 IMPLANT
SUT VIC AB 0 CT1 27XCR 8 STRN (SUTURE) ×6 IMPLANT
SUT VIC AB 2-0 CT1 (SUTURE) ×8 IMPLANT
SUT VICRYL 0 TIES 12 18 (SUTURE) ×4 IMPLANT
SUT VICRYL 0 UR6 27IN ABS (SUTURE) ×8 IMPLANT
SYR BULB IRRIGATION 50ML (SYRINGE) ×4 IMPLANT
TOWEL OR 17X24 6PK STRL BLUE (TOWEL DISPOSABLE) ×8 IMPLANT
TRAY FOLEY CATH 14FR (SET/KITS/TRAYS/PACK) ×4 IMPLANT
TROCAR OPTI TIP 5M 100M (ENDOMECHANICALS) ×4 IMPLANT
TROCAR XCEL NON-BLD 11X100MML (ENDOMECHANICALS) ×4 IMPLANT
WARMER LAPAROSCOPE (MISCELLANEOUS) ×4 IMPLANT
WATER STERILE IRR 1000ML POUR (IV SOLUTION) ×4 IMPLANT

## 2014-08-01 NOTE — Transfer of Care (Signed)
Immediate Anesthesia Transfer of Care Note  Patient: Veronica Day  Procedure(s) Performed: Procedure(s): LAPAROSCOPIC ASSISTED VAGINAL HYSTERECTOMY (N/A) LAPAROSCOPIC BILATERAL SALPINGO OOPHORECTOMY (Bilateral) CYSTOSCOPY (N/A)  Patient Location: PACU  Anesthesia Type:General  Level of Consciousness: awake, alert  and oriented  Airway & Oxygen Therapy: Patient Spontanous Breathing and Patient connected to nasal cannula oxygen  Post-op Assessment: Report given to PACU RN and Post -op Vital signs reviewed and stable  Post vital signs: Reviewed and stable  Complications: No apparent anesthesia complications

## 2014-08-01 NOTE — Anesthesia Postprocedure Evaluation (Signed)
  Anesthesia Post Note  Patient: Veronica Day  Procedure(s) Performed: Procedure(s) (LRB): LAPAROSCOPIC ASSISTED VAGINAL HYSTERECTOMY (N/A) LAPAROSCOPIC BILATERAL SALPINGO OOPHORECTOMY (Bilateral) CYSTOSCOPY (N/A)  Anesthesia type: GA  Patient location: PACU  Post pain: Pain level controlled  Post assessment: Post-op Vital signs reviewed  Last Vitals:  Filed Vitals:   08/01/14 1700  BP: 105/51  Pulse: 68  Temp:   Resp: 17    Post vital signs: Reviewed  Level of consciousness: sedated  Complications: No apparent anesthesia complications

## 2014-08-01 NOTE — Brief Op Note (Signed)
08/01/2014  4:04 PM  PATIENT:  Veronica Day  48 y.o. female  PRE-OPERATIVE DIAGNOSIS:  FIBROIDS  POST-OPERATIVE DIAGNOSIS:  fibroids  PROCEDURE:  Procedure(s): LAPAROSCOPIC ASSISTED VAGINAL HYSTERECTOMY (N/A) LAPAROSCOPIC BILATERAL SALPINGO OOPHORECTOMY (Bilateral) CYSTOSCOPY (N/A)  SURGEON:  Surgeon(s) and Role:    * Sanjuana Kava, MD - Primary    * Gus Height, MD - Assisting   ANESTHESIA:   local and general  EBL:  Total I/O In: 1000 [I.V.:1000] Out: 350 [Urine:200; Blood:150]  BLOOD ADMINISTERED:none  DRAINS: Urinary Catheter (Foley)   LOCAL MEDICATIONS USED:  MARCAINE    and LIDOCAINE   SPECIMEN:  Source of Specimen:  uterus, bilateral tubes and ovaries  DISPOSITION OF SPECIMEN:  PATHOLOGY  COUNTS:  YES  TOURNIQUET:  * No tourniquets in log *  DICTATION: .Note written in EPIC  PLAN OF CARE: Admit for overnight observation  PATIENT DISPOSITION:  PACU - hemodynamically stable.   Delay start of Pharmacological VTE agent (>24hrs) due to surgical blood loss or risk of bleeding: not applicable  Karrington Studnicka STACIA

## 2014-08-01 NOTE — H&P (Signed)
Adler M Dohner is an 49 y.o. female P3 using BTL for contraception with long history of abnormal uterine bleeding. Her menses is getting heavier with age and she passes large blood clots. She notes that the pelvic US shows she has a large fundal fibroid. The bleeding has slowed some when she is at rest. Her abnormal bleeding started when she turned 72, when her menses became more frequent and heavier. Prior to the 17th of September she had not had a period for 6 weeks and was having mucous discharge x 3 weeks then she had a menses 17the September which was heavy with passage of clots. She passed very large blood clots on the 21st with hemorrhage using super tampon with pantiliner and soaked through q 5 min. She is now using a box of pads every 4 days. She feels more fatigued, tired. She also has severe dysmenorrhea.   Pertinent Gynecological History: Menses: flow is excessive with use of 4 pads or tampons on heaviest days Bleeding: dysfunctional uterine bleeding Contraception: tubal ligation DES exposure: denies Blood transfusions: none Sexually transmitted diseases: no past history Previous GYN Procedures: myomectomy ; tubal  Last mammogram: normal Date: 04/2014 Last pap: normal Date: 04/2014 OB History: G5, P3   Menstrual History: Menarche age: 74  No LMP recorded.    Past Medical History  Diagnosis Date  . Allergy   . Anemia     h/o with pregnancy  . Menorrhagia with irregular cycle 04/03/2014  . Headache     MIGRAINES  . PONV (postoperative nausea and vomiting)     Past Surgical History  Procedure Laterality Date  . Tubal ligation    . Laparoscopy N/A 05/12/2013    Procedure: LAPAROSCOPY DIAGNOSTIC, BIOPSY OF PERITONEUM, DRAINAGE OF RIGHT FOLLICULAR CYST;  Surgeon: Princess Bruins, MD;  Location: [facility];  Service: Gynecology;  Laterality: N/A;    Family History  Problem Relation Age of Onset  . Thyroid disease Mother   . Heart disease Father   . Thyroid disease Sister    . Hypertension Brother     Social History:  reports that she has been smoking Cigarettes.  She has a 20 pack-year smoking history. She has never used smokeless tobacco. She reports that she drinks alcohol. She reports that she does not use illicit drugs.  Allergies:  Allergies  Allergen Reactions  . Codeine Nausea And Vomiting  . Tramadol Nausea And Vomiting    Prescriptions prior to admission  Medication Sig Dispense Refill Last Dose  . Ascorbic Acid (VITAMIN C PO) Take 1 tablet by mouth daily.     . Fe Fum-Fe Poly-Vit C-Lactobac (FUSION PO) Take 1 tablet by mouth daily.     Marland Kitchen ibuprofen (ADVIL,MOTRIN) 200 MG tablet Take 400-800 mg by mouth every 6 (six) hours as needed for headache (depends on pain).     Marland Kitchen ibuprofen (ADVIL,MOTRIN) 800 MG tablet Take 1 tablet (800 mg total) by mouth every 8 (eight) hours as needed. (Patient not taking: Reported on 07/24/2014) 30 tablet 0   . Multiple Vitamins-Minerals (MULTIVITAMIN WITH MINERALS) tablet Take 1 tablet by mouth daily.   04/03/2014 at Unknown time  . norethindrone (AYGESTIN) 5 MG tablet Take 1 tablet (5 mg total) by mouth daily. (Patient not taking: Reported on 07/24/2014) 21 tablet 0   . PARoxetine Mesylate (BRISDELLE) 7.5 MG CAPS Take 7.5 mg by mouth daily.        Review of Systems  Constitutional: Positive for malaise/fatigue. Negative for fever and chills.  Eyes:  Negative for blurred vision.  Respiratory: Negative for cough.   Cardiovascular: Negative for chest pain.  Gastrointestinal: Negative for heartburn and nausea.  Genitourinary: Negative for dysuria.  Skin: Negative for rash.  Neurological: Negative for dizziness, tingling, tremors and headaches.  Endo/Heme/Allergies: Does not bruise/bleed easily.  All other systems reviewed and are negative.   There were no vitals taken for this visit. Physical Exam  Constitutional: She is oriented to person, place, and time. She appears well-developed and well-nourished.  HENT:  Head:  Normocephalic and atraumatic.  Eyes: Pupils are equal, round, and reactive to light.  Cardiovascular: Normal rate and regular rhythm.   Respiratory: Effort normal and breath sounds normal.  GI: Soft.  Genitourinary:  Vulva: no masses, atrophy, or lesions. Vagina: no tenderness, erythema, cystocele, rectocele, abnormal vaginal discharge, or vesicle(s) or ulcers. Cervix: no discharge or cervical motion tenderness and grossly normal and sample taken for a Pap smear. Uterus: enlarged and fibroids and midline, mobile, non-tender, and no uterine prolapse. Bladder/Urethra: no urethral discharge or mass and normal meatus, bladder non distended, and Urethra well supported. Adnexa/Parametria: no parametrial tenderness or mass and no adnexal tenderness or ovarian mass.  Musculoskeletal: Normal range of motion.  Neurological: She is alert and oriented to person, place, and time. She has normal reflexes.  Skin: Skin is warm.  Psychiatric: She has a normal mood and affect.    No results found for this or any previous visit (from the past 24 hour(s)).  No results found.  Assessment/Plan: 49 yo with symptomatic fibroid uterus  We discussed that the surgery may not alleviate her pain, however hopefully since we are removing her ovaries as well her pain will resolve. Normal endometrial biopsy, US shows fibroids. We had previously discussed all treatment options to include no intervention, Depo Lupron / Depo Provera, IUD, myomectomy endometrial ablation, [place].  Pt desires definitive surgical intervention.  The R/B of hysterectomy were discussed w/ the pt to include, but not limited to bleeding/transfusion, infection, wound breakdown/poor healing, damage to organs in abd/pelvis w/ possible need for further surgical repair, need for abdominal incision to complete procedure, blood clot, PE/MI/stroke. recurrent prolapse, worsening of UI sx, possible need for [place] or further surgery for repair; pt understands these  risks and consents to surgery.  Additionally, risks/benefits of ovarian preservation discussed to include 1/70 lifetime risk of ovarian cancer and 5-10% risk for future surgery after hysterectomy.  PT desires removal of her ovaries at time of surgery.    She is a smoker and we discussed that she will undergo surgical menopause, and I will be unable to put her on hormone therapy.  She voiced understanding. Will start Brisdelle to help with the menopausal symptoms.   She is on schedule for LAVH / BSO  / Cysto for January 19th   Sanjuana Kava [facility] 08/01/2014, 12:36 PM

## 2014-08-02 ENCOUNTER — Encounter (HOSPITAL_COMMUNITY): Payer: Self-pay | Admitting: Obstetrics & Gynecology

## 2014-08-02 DIAGNOSIS — N938 Other specified abnormal uterine and vaginal bleeding: Secondary | ICD-10-CM | POA: Diagnosis not present

## 2014-08-02 LAB — CBC
HCT: 33.1 % — ABNORMAL LOW (ref 36.0–46.0)
Hemoglobin: 11.5 g/dL — ABNORMAL LOW (ref 12.0–15.0)
MCH: 31.5 pg (ref 26.0–34.0)
MCHC: 34.7 g/dL (ref 30.0–36.0)
MCV: 90.7 fL (ref 78.0–100.0)
Platelets: 193 10*3/uL (ref 150–400)
RBC: 3.65 MIL/uL — ABNORMAL LOW (ref 3.87–5.11)
RDW: 12.1 % (ref 11.5–15.5)
WBC: 13.9 10*3/uL — ABNORMAL HIGH (ref 4.0–10.5)

## 2014-08-02 MED ORDER — OXYCODONE-ACETAMINOPHEN 5-325 MG PO TABS: 0.5000 | ORAL_TABLET | ORAL | Status: DC | PRN

## 2014-08-02 MED ORDER — IBUPROFEN 800 MG PO TABS: 800.0000 mg | ORAL_TABLET | Freq: Three times a day (TID) | ORAL | Status: DC | PRN

## 2014-08-02 NOTE — Discharge Summary (Signed)
Physician Discharge Summary  Patient ID: LARAY RIVKIN MRN: [id number] DOB/AGE: January 08, 1966 49 y.o.  Admit date: 08/01/2014 Discharge date: 08/02/2014  Admission Diagnoses:  Discharge Diagnoses:  Active Problems:   S/P laparoscopic assisted vaginal hysterectomy (LAVH)   Discharged Condition: good  Hospital Course: Patient taken to OR for LAVH / BSO / Cystoscopy. No perioperative or post operative complications.  Patient did well and was discharged on POD #1  Consults: None  Significant Diagnostic Studies: labs: post op Hb 11  Treatments: IV hydration and surgery: LAVH / BSO  Discharge Exam: Blood pressure 101/59, pulse 48, temperature 98.7 F (37.1 C), temperature source Oral, resp. rate 18, height 5\' 3"  (1.6 m), weight 63.957 kg (141 lb), SpO2 100 %. General: alert, cooperative and no distress Resp: clear to auscultation bilaterally Cardio: regular rate and rhythm, S1, S2 normal, no murmur, click, rub or gallop GI: soft, non-tender; bowel sounds normal; no masses,  no organomegaly, normal findings: bowel sounds normal and incision: clean, dry and intact Extremities: extremities normal, atraumatic, no cyanosis or edema, Homans sign is negative, no sign of DVT and no edema, redness or tenderness in the calves or thighs Vaginal Bleeding: none  Disposition: 01-Home or Self Care  Discharge Instructions     Remove dressing in 72 hours    Complete by:  As directed   Belly button dressing remove in one week     Call MD for:  difficulty breathing, headache or visual disturbances    Complete by:  As directed      Call MD for:  hives    Complete by:  As directed      Call MD for:  persistant dizziness or light-headedness    Complete by:  As directed      Call MD for:  persistant nausea and vomiting    Complete by:  As directed      Call MD for:  redness, tenderness, or signs of infection (pain, swelling, redness, odor or green/yellow discharge around incision site)    Complete  by:  As directed      Call MD for:  severe uncontrolled pain    Complete by:  As directed      Call MD for:  temperature >100.4    Complete by:  As directed      Call MD for:    Complete by:  As directed   Abnormal foul smelling vaginal discharge or heavy vaginal bleeding     Diet - low sodium heart healthy    Complete by:  As directed      Discharge instructions    Complete by:  As directed   Call for your post op visit and if you have any questions or concerns.     Driving Restrictions    Complete by:  As directed   No driving 42-59 days     Increase activity slowly    Complete by:  As directed      Lifting restrictions    Complete by:  As directed   No lifting anything heavier than 20 pounds     Sexual Activity Restrictions    Complete by:  As directed   No intercourse or anything in vagina for 4-6 weeks or until seen at post op visit.            Medication List    STOP taking these medications        multivitamin with minerals tablet     norethindrone 5 MG tablet  Commonly known as:  AYGESTIN      TAKE these medications        acetaminophen 325 MG tablet  Commonly known as:  TYLENOL  Take 650 mg by mouth every 6 (six) hours as needed.     BRISDELLE 7.5 MG Caps  Generic drug:  PARoxetine Mesylate  Take 7.5 mg by mouth daily.     FUSION PO  Take 1 tablet by mouth daily.     ibuprofen 800 MG tablet  Commonly known as:  ADVIL,MOTRIN  Take 1 tablet (800 mg total) by mouth every 8 (eight) hours as needed.     ibuprofen 800 MG tablet  Commonly known as:  ADVIL,MOTRIN  Take 1 tablet (800 mg total) by mouth every 8 (eight) hours as needed (mild pain).     oxyCODONE-acetaminophen 5-325 MG per tablet  Commonly known as:  PERCOCET/ROXICET  Take 0.5 tablets by mouth every 4 (four) hours as needed (moderate to severe pain (when tolerating fluids)).     VITAMIN C PO  Take 1 tablet by mouth daily.         SignedCaffie Damme 08/02/2014, 12:04  PM

## 2014-08-02 NOTE — Addendum Note (Signed)
Addendum  created 08/02/14 0819 by Raenette Rover, CRNA   Modules edited: Notes Section   Notes Section:  File: [id number]

## 2014-08-02 NOTE — Op Note (Signed)
OPERATIVE NOTE  08/01/2014  4:04 PM  PATIENT: Dave M Riggi 49 y.o. female  PRE-OPERATIVE DIAGNOSIS: FIBROIDS  POST-OPERATIVE DIAGNOSIS: fibroids  PROCEDURE: Procedure(s): LAPAROSCOPIC ASSISTED VAGINAL HYSTERECTOMY (N/A) LAPAROSCOPIC BILATERAL SALPINGO OOPHORECTOMY (Bilateral) CYSTOSCOPY (N/A)  SURGEON: Surgeon(s) and Role:  * Sanjuana Kava, MD - Primary  * Gus Height, MD - Assisting   ANESTHESIA: local and general  EBL: Total I/O In: 1000 [I.V.:1000] Out: 350 [Urine:200; Blood:150]  BLOOD ADMINISTERED:none  DRAINS: Urinary Catheter (Foley)   LOCAL MEDICATIONS USED: MARCAINE and LIDOCAINE   SPECIMEN: Source of Specimen: uterus, bilateral tubes and ovaries  DISPOSITION OF SPECIMEN: PATHOLOGY  COUNTS: YES  The patient presents with the above-mentioned diagnosis. She understands the indications for surgical procedure.  She also understands the alternative treatment options. She accepts the risk of, but not limited to, anesthetic complications, bleeding, infections, and possible damage to the surrounding organs.  Findings: Exam under anesthesia: external vulva normal vaginal vault normal cervix grossly normal uterus 14 weeks size mobile good descent laparoscopy: fundal anterior fibroid normal appearing ovaries and fallopian tubes bilaterally. Antony Haste Master window posterior cul de sac otherwise no other findings of endometriosis. Normal appearing appendix, normal liver edge and gallbladder.   Procedure:  The patient was taken to the operating room where a general anesthetic was given. The patient's abdomen was prepped with ChloraPrep. The perineum and vagina were prepped with multiple layers of Betadine. A Foley catheter was placed in the bladder. An examination under anesthesia was performed. A Hulka tenaculum was placed inside the uterus. An incision was made in the umbilicus with 11 blade scalpel. A 85mm Excel Visiport trocar was used with l33mm 0 degree  laparoscope attached to perform direct entry into the patient's abdomen. Direct video visualization confirmed entry and pneumoperitoneum was obtained using approximately 3L CO2 gas. The patient was placed in steep Trendelenburg position. A small incision was made and a 5 mm trocar was inserted into the abdominal cavity along the right and left pelvis under direct visualization. There was no injury noted with placement of trocars. The pelvic contents were visualized and findings noted above, both ureters were identified crossing the pelvic brim and pelvic sidewall. The ureter was visualized on both sides and noted to be coursing well below the operative field. Pictures were taken of the patient's pelvic structures. The left infundibulopelvic ligament was identified, cauterized and cut using the 5 mm Ligasure. The round ligament and ovarian ligaments were sequentially cauterized and cut using the 48mm Ligasure.  The bladder flap was developed anteriorly. This was then done on the right side, where the right IP was transected as well as the right round ligament was cauterized and cut using the 79mm Ligasure.  The bladder flap was developed further. The uterine arteries were coagulated and transected bilaterally using the 52mm Ligasure. At this point we felt we were ready to proceed with the vaginal portion of the procedure. The patient was placed in a more lithotomy position. A weighted speculum was placed in the posterior vagina. The cervix was injected with half percent Marcaine with epinephrine. A circumferential incision was made around the cervix. The vaginal mucosa was advanced anteriorly and posteriorly. The anterior cul-de-sac and in the posterior cul-de-sac were sharply entered. Alternating from right to left the uterosacral ligaments, paracervical tissues, parametrial tissues, were clamped, cut, sutured, and tied securely with 0-Vicryl. The uterus with the fallopian tubes were removed from the operative  field. Hemostasis was confirmed. The sutures attached to the uterosacral ligaments were brought out  through the vaginal angles and then tied securely. A McCall culdoplasty suture was placed in the posterior cul-de-sac incorporating the uterosacral ligaments bilaterally and the posterior peritoneum. A final check was made for hemostasis and again hemostasis was confirmed. The vaginal cuff was closed using figure-of-eight sutures incorporating the anterior vaginal mucosa, the anterior peritoneum, posterior peritoneum, and the posterior vaginal mucosa. The McCall culdoplasty suture was tied securely and the apex of the vagina was noted to elevate into the midpelvis. The operator then changed gown and gloves. The pneumoperitoneum was reestablished. The pelvis was inspected and hemostasis was adequate. The pelvis was irrigated. We felt that we are ready to end the procedure. The 5 mm trochars were removed under direct visualization. The pneumoperitoneum was allowed to escape. The subumbilical trocar was removed. The subumbilical incision was closed using a deep suture of 0 Vicryl followed by skin closures of 3-0 Monocryl. The patient tolerated her procedure well. She was awakened from her anesthetic without difficulty and then transported to the recovery room in stable condition. Sponge, needle, and instrument counts were correct on 2 occasions. The estimated blood loss was 150 cc's. 0 Vicryl is the suture material used throughout the procedure. The uterus was sent to pathology.  Jama Mcmiller STACIA

## 2014-08-02 NOTE — Progress Notes (Signed)
1 Day Post-Op Procedure(s) (LRB): LAPAROSCOPIC ASSISTED VAGINAL HYSTERECTOMY (N/A) LAPAROSCOPIC BILATERAL SALPINGO OOPHORECTOMY (Bilateral) CYSTOSCOPY (N/A)  Subjective: Patient reports incisional pain, tolerating PO and no problems voiding.    Objective: I have reviewed patient's vital signs, intake and output, medications and labs.  General: alert, cooperative and no distress Resp: clear to auscultation bilaterally Cardio: regular rate and rhythm, S1, S2 normal, no murmur, click, rub or gallop GI: soft, non-tender; bowel sounds normal; no masses,  no organomegaly, normal findings: bowel sounds normal and incision: clean, dry and intact Extremities: extremities normal, atraumatic, no cyanosis or edema, Homans sign is negative, no sign of DVT and no edema, redness or tenderness in the calves or thighs Vaginal Bleeding: none  Assessment: s/p Procedure(s): LAPAROSCOPIC ASSISTED VAGINAL HYSTERECTOMY (N/A) LAPAROSCOPIC BILATERAL SALPINGO OOPHORECTOMY (Bilateral) CYSTOSCOPY (N/A): progressing well and tolerating diet  Plan: Encourage ambulation Discharge home  LOS: 1 day    Diedre Maclellan, Donald 08/02/2014, 11:59 AM

## 2014-08-02 NOTE — Progress Notes (Signed)
Ur chart review completed.  

## 2014-08-02 NOTE — Progress Notes (Signed)
Pt is discharged in the care of daugther per ambulatory. Spirits are good. Denies any pain or discomfort. Understands all discharge instructions Questions were asked and answered. . No equipment needed for home use.

## 2014-08-02 NOTE — Anesthesia Postprocedure Evaluation (Signed)
  Anesthesia Post-op Note  Patient: Veronica Day  Procedure(s) Performed: Procedure(s): LAPAROSCOPIC ASSISTED VAGINAL HYSTERECTOMY (N/A) LAPAROSCOPIC BILATERAL SALPINGO OOPHORECTOMY (Bilateral) CYSTOSCOPY (N/A)  Patient Location: [facility]  Anesthesia Type:General  Level of Consciousness: awake, alert , oriented and patient cooperative  Airway and Oxygen Therapy: Patient Spontanous Breathing  Post-op Pain: mild  Post-op Assessment: Post-op Vital signs reviewed, Patient's Cardiovascular Status Stable, Respiratory Function Stable, Patent Airway and No signs of Nausea or vomiting  Post-op Vital Signs: Reviewed and stable  Last Vitals:  Filed Vitals:   08/02/14 0545  BP: 100/58  Pulse: 52  Temp: 36.9 C  Resp: 16    Complications: No apparent anesthesia complications

## 2014-10-27 ENCOUNTER — Other Ambulatory Visit: Payer: Self-pay | Admitting: Obstetrics & Gynecology

## 2014-10-27 DIAGNOSIS — Z90722 Acquired absence of ovaries, bilateral: Secondary | ICD-10-CM

## 2014-10-27 DIAGNOSIS — R102 Pelvic and perineal pain: Secondary | ICD-10-CM

## 2014-11-01 ENCOUNTER — Other Ambulatory Visit: Payer: BLUE CROSS/BLUE SHIELD

## 2015-01-18 ENCOUNTER — Ambulatory Visit (INDEPENDENT_AMBULATORY_CARE_PROVIDER_SITE_OTHER): Payer: BLUE CROSS/BLUE SHIELD | Admitting: Family Medicine

## 2015-01-18 VITALS — BP 124/70 | HR 69 | Temp 98.2°F | Resp 18 | Ht 63.0 in | Wt 148.0 lb

## 2015-01-18 DIAGNOSIS — H109 Unspecified conjunctivitis: Secondary | ICD-10-CM

## 2015-01-18 MED ORDER — GENTAMICIN SULFATE 0.3 % OP SOLN: 2.0000 [drp] | OPHTHALMIC | Status: DC

## 2015-01-18 NOTE — Progress Notes (Addendum)
Subjective:  This chart was scribed for Delman Cheadle, MD by Moises Blood, Medical Scribe. This patient was seen in Room 10 and the patient's care was started 8:21 PM.    Patient ID: Veronica Day, female    DOB: 1966/01/21, 49 y.o.   MRN: [id number] Chief Complaint  Patient presents with  . Conjunctivitis    Left eye irritation x 2-3 days, itchy & red today    HPI Veronica Day is a 49 y.o. female who presents to [facility] complaining of left eye irritation with associated eye itchiness and redness for the past 2-3 days. Today, she notes the left eye has become watery. She says that vision through her left eye is constantly blurry. She feels that the outside corner is watery and the inside corner feels like something in there. She also mentions some crusting, and clear drainage this morning. She normally wears glasses. She denies wearing contacts. She denies eye itchiness and similar symptoms in her right eye. She denies fever, chills, congestion, and sore throat. She denies using any eye drops. No known allergies to any antibiotics.   She works at [employer] at [employer].    Past Medical History  Diagnosis Date  . Allergy   . Anemia     h/o with pregnancy  . Menorrhagia with irregular cycle 04/03/2014  . Headache     MIGRAINES  . PONV (postoperative nausea and vomiting)    Current Outpatient Prescriptions on File Prior to Visit  Medication Sig Dispense Refill  . acetaminophen (TYLENOL) 325 MG tablet Take 650 mg by mouth every 6 (six) hours as needed.    . Ascorbic Acid (VITAMIN C PO) Take 1 tablet by mouth daily.    . Fe Fum-Fe Poly-Vit C-Lactobac (FUSION PO) Take 1 tablet by mouth daily.    Marland Kitchen PARoxetine Mesylate (BRISDELLE) 7.5 MG CAPS Take 7.5 mg by mouth daily.     Marland Kitchen ibuprofen (ADVIL,MOTRIN) 800 MG tablet Take 1 tablet (800 mg total) by mouth every 8 (eight) hours as needed. (Patient not taking: Reported on 01/18/2015) 30 tablet 0  . ibuprofen (ADVIL,MOTRIN) 800 MG tablet  Take 1 tablet (800 mg total) by mouth every 8 (eight) hours as needed (mild pain). (Patient not taking: Reported on 01/18/2015) 60 tablet 3  . oxyCODONE-acetaminophen (PERCOCET/ROXICET) 5-325 MG per tablet Take 0.5 tablets by mouth every 4 (four) hours as needed (moderate to severe pain (when tolerating fluids)). (Patient not taking: Reported on 01/18/2015) 30 tablet 0   No current facility-administered medications on file prior to visit.   Allergies  Allergen Reactions  . Codeine Nausea And Vomiting  . Tramadol Nausea And Vomiting     Review of Systems  Constitutional: Negative for fever and chills.  HENT: Negative for congestion, rhinorrhea and sore throat.   Eyes: Positive for discharge (left eye), redness (left eye), itching (left eye) and visual disturbance (left eye blurry).  Gastrointestinal: Negative for nausea, vomiting, diarrhea and constipation.  Neurological: Negative for dizziness and headaches.       Objective:   Physical Exam  Constitutional: She is oriented to person, place, and time. She appears well-developed and well-nourished. No distress.  HENT:  Head: Normocephalic and atraumatic.  Eyes: EOM are normal. Pupils are equal, round, and reactive to light. Right eye exhibits no chemosis, no discharge, no exudate and no hordeolum. No foreign body present in the right eye. Left eye exhibits discharge. Left eye exhibits no chemosis, no exudate and no hordeolum. No  foreign body present in the left eye. Right conjunctiva is not injected. Right conjunctiva has no hemorrhage. Left conjunctiva is not injected. Left conjunctiva has no hemorrhage. No scleral icterus.  Slit lamp exam:      The left eye shows no corneal abrasion, no corneal ulcer and no fluorescein uptake.  funduscopic exam benign Small amount of green mucoid discharge from medial epicanthus Very subtle amount of sclera injection.  Neck: Neck supple.  Cardiovascular: Normal rate.   Pulmonary/Chest: Effort normal. No  respiratory distress.  Musculoskeletal: Normal range of motion.  Neurological: She is alert and oriented to person, place, and time.  Skin: Skin is warm and dry.  Psychiatric: She has a normal mood and affect. Her behavior is normal.  Nursing note and vitals reviewed.   BP 124/70 mmHg  Pulse 69  Temp(Src) 98.2 F (36.8 C) (Oral)  Resp 18  Ht 5\' 3"  (1.6 m)  Wt 148 lb (67.132 kg)  BMI 26.22 kg/m2  SpO2 97%  LMP 04/27/2014   Visual Acuity Screening   Right eye Left eye Both eyes  Without correction:     With correction: 20 20 20 25 20 25        Assessment & Plan:   1. Conjunctivitis of left eye     Meds ordered this encounter  Medications  . gentamicin (GARAMYCIN) 0.3 % ophthalmic solution    Sig: Place 2 drops into the left eye every 4 (four) hours.    Dispense:  5 mL    Refill:  0    I personally performed the services described in this documentation, which was scribed in my presence. The recorded information has been reviewed and considered, and addended by me as needed.  Delman Cheadle, MD MPH

## 2015-01-18 NOTE — Patient Instructions (Signed)

## 2015-01-25 ENCOUNTER — Ambulatory Visit (INDEPENDENT_AMBULATORY_CARE_PROVIDER_SITE_OTHER): Payer: BLUE CROSS/BLUE SHIELD | Admitting: Family Medicine

## 2015-01-25 ENCOUNTER — Telehealth: Payer: Self-pay

## 2015-01-25 DIAGNOSIS — H109 Unspecified conjunctivitis: Secondary | ICD-10-CM | POA: Diagnosis not present

## 2015-01-25 MED ORDER — TOBRAMYCIN-DEXAMETHASONE 0.3-0.1 % OP SUSP: 2.0000 [drp] | Freq: Four times a day (QID) | OPHTHALMIC | Status: DC

## 2015-01-25 NOTE — Telephone Encounter (Signed)
Pt was seen 01/18/15 at 8:15p about her eye. Didn't think it was pink eye and administered eye drops Brigitte Pulse). This morning she called her eyes were stuck together and stated drops were not working since she is using them every day. There's a possibility she may come in today after work but if not, she was wondering if she could be administered different eye drops?  Please advise [phone]

## 2015-01-25 NOTE — Telephone Encounter (Signed)
Assessment & Plan:   1. Conjunctivitis of left eye     Meds ordered this encounter  Medications  . gentamicin (GARAMYCIN) 0.3 % ophthalmic solution    Sig: Place 2 drops into the left eye every 4 (four) hours.    Dispense: 5 mL    Refill: 0    I personally performed the services described in this documentation, which was scribed in my presence. The recorded information has been reviewed and considered, and addended by me as needed.        This was a week ago on 7/7. Spoke with pt, she states It is still sticky and red. Her other eye is a little red also. Can we Rx another ABX? Please advise.

## 2015-01-25 NOTE — Patient Instructions (Signed)
Bacterial Conjunctivitis °Bacterial conjunctivitis (commonly called pink eye) is redness, soreness, or puffiness (inflammation) of the white part of your eye. It is caused by a germ called bacteria. These germs can easily spread from person to person (contagious). Your eye often will become red or pink. Your eye may also become irritated, watery, or have a thick discharge.  °HOME CARE  °· Apply a cool, clean washcloth over closed eyelids. Do this for 10-20 minutes, 3-4 times a day while you have pain. °· Gently wipe away any fluid coming from the eye with a warm, wet washcloth or cotton ball. °· Wash your hands often with soap and water. Use paper towels to dry your hands. °· Do not share towels or washcloths. °· Change or wash your pillowcase every day. °· Do not use eye makeup until the infection is gone. °· Do not use machines or drive if your vision is blurry. °· Stop using contact lenses. Do not use them again until your doctor says it is okay. °· Do not touch the tip of the eye drop bottle or medicine tube with your fingers when you put medicine on the eye. °GET HELP RIGHT AWAY IF:  °· Your eye is not better after 3 days of starting your medicine. °· You have a yellowish fluid coming out of the eye. °· You have more pain in the eye. °· Your eye redness is spreading. °· Your vision becomes blurry. °· You have a fever or lasting symptoms for more than 2-3 days. °· You have a fever and your symptoms suddenly get worse. °· You have pain in the face. °· Your face gets red or puffy (swollen). °MAKE SURE YOU:  °· Understand these instructions. °· Will watch this condition. °· Will get help right away if you are not doing well or get worse. °Document Released: 04/08/2008 Document Revised: 06/16/2012 Document Reviewed: 03/05/2012 °ExitCare® Patient Information ©2015 [employer]. This information is not intended to replace advice given to you by your health care provider. Make sure you discuss any questions you have  with your health care provider. ° °

## 2015-01-25 NOTE — Telephone Encounter (Signed)
Due to the length of time a visit is warranted either with [place] or her eye MD.

## 2015-01-25 NOTE — Progress Notes (Signed)
Chief Complaint:  Chief Complaint  Patient presents with  . Conjunctivitis    seen last week and treated for pink eye.  the drops are not helping and now the right eye is starting to turn red and itch.     HPI: Veronica Day is a 49 y.o. female who reports to [facility] today complaining of bilateral conjunctivitis, no photophobia, no pain. Her visions of blurred due to although drainage that is green in her eyes. She has no as previously hearing was put on gentamicin eyedrops without any relief.  It was primarily left eye but now is in her right eye.  Veronica Day is a 49 y.o. female who presents to [facility] complaining of left eye irritation with associated eye itchiness and redness for the past 2-3 days. Today, she notes the left eye has become watery. She says that vision through her left eye is constantly blurry. She feels that the outside corner is watery and the inside corner feels like something in there. She also mentions some crusting, and clear drainage this morning. She normally wears glasses. She denies wearing contacts. She denies eye itchiness and similar symptoms in her right eye. She denies fever, chills, congestion, and sore throat. She denies using any eye drops. No known allergies to any antibiotics.   She works at [employer] at [employer].   Past Medical History  Diagnosis Date  . Allergy   . Anemia     h/o with pregnancy  . Menorrhagia with irregular cycle 04/03/2014  . Headache     MIGRAINES  . PONV (postoperative nausea and vomiting)    Past Surgical History  Procedure Laterality Date  . Tubal ligation    . Laparoscopy N/A 05/12/2013    Procedure: LAPAROSCOPY DIAGNOSTIC, BIOPSY OF PERITONEUM, DRAINAGE OF RIGHT FOLLICULAR CYST;  Surgeon: Princess Bruins, MD;  Location: [facility];  Service: Gynecology;  Laterality: N/A;  . Laparoscopic assisted vaginal hysterectomy N/A 08/01/2014    Procedure: LAPAROSCOPIC ASSISTED VAGINAL HYSTERECTOMY;  Surgeon:  Sanjuana Kava, MD;  Location: [facility];  Service: Gynecology;  Laterality: N/A;  . Laparoscopic bilateral salpingo oopherectomy Bilateral 08/01/2014    Procedure: LAPAROSCOPIC BILATERAL SALPINGO OOPHORECTOMY;  Surgeon: Sanjuana Kava, MD;  Location: [place] [facility];  Service: Gynecology;  Laterality: Bilateral;  . Cystoscopy N/A 08/01/2014    Procedure: CYSTOSCOPY;  Surgeon: Sanjuana Kava, MD;  Location: [place] [facility];  Service: Gynecology;  Laterality: N/A;  . Abdominal hysterectomy     History   Social History  . Marital Status: Divorced    Spouse Name: N/A  . Number of Children: N/A  . Years of Education: N/A   Social History Main Topics  . Smoking status: Current Every Day Smoker -- 1.00 packs/day for 20 years    Types: Cigarettes  . Smokeless tobacco: Never Used  . Alcohol Use: 0.0 oz/week    0 Standard drinks or equivalent per week     Comment: occasionally  . Drug Use: No  . Sexual Activity: Yes   Other Topics Concern  . None   Social History Narrative   Family History  Problem Relation Age of Onset  . Thyroid disease Mother   . Hyperlipidemia Mother   . Heart disease Father   . Thyroid disease Sister   . Hypertension Brother   . Stroke Brother    Allergies  Allergen Reactions  . Codeine Nausea And Vomiting  . Tramadol Nausea And Vomiting   Prior to Admission medications  Medication Sig Start Date End Date Taking? Authorizing Provider  acetaminophen (TYLENOL) 325 MG tablet Take 650 mg by mouth every 6 (six) hours as needed.   Yes Historical Provider, MD  Ascorbic Acid (VITAMIN C PO) Take 1 tablet by mouth daily.   Yes Historical Provider, MD  Fe Fum-Fe Poly-Vit C-Lactobac (FUSION PO) Take 1 tablet by mouth daily.   Yes Historical Provider, MD  gentamicin (GARAMYCIN) 0.3 % ophthalmic solution Place 2 drops into the left eye every 4 (four) hours. 01/18/15  Yes Shawnee Knapp, MD  PARoxetine Mesylate (BRISDELLE) 7.5 MG CAPS Take 7.5 mg by mouth daily.  07/20/14  Yes Historical Provider, MD    ibuprofen (ADVIL,MOTRIN) 800 MG tablet Take 1 tablet (800 mg total) by mouth every 8 (eight) hours as needed. Patient not taking: Reported on 01/18/2015 04/03/14   Laury Deep, CNM  ibuprofen (ADVIL,MOTRIN) 800 MG tablet Take 1 tablet (800 mg total) by mouth every 8 (eight) hours as needed (mild pain). Patient not taking: Reported on 01/18/2015 08/02/14   Sanjuana Kava, MD  oxyCODONE-acetaminophen (PERCOCET/ROXICET) 5-325 MG per tablet Take 0.5 tablets by mouth every 4 (four) hours as needed (moderate to severe pain (when tolerating fluids)). Patient not taking: Reported on 01/18/2015 08/02/14   Sanjuana Kava, MD     ROS: The patient denies fevers, chills, night sweats, unintentional weight loss, chest pain, palpitations, wheezing, dyspnea on exertion, nausea, vomiting, abdominal pain, dysuria, hematuria, melena, numbness, weakness, or tingling.   All other systems have been reviewed and were otherwise negative with the exception of those mentioned in the HPI and as above.    PHYSICAL EXAM: Filed Vitals:   01/25/15 1831  BP: 104/60  Pulse: 64  Temp: 98 F (36.7 C)  Resp: 16   Body mass index is 25.5 kg/(m^2).   General: Alert, no acute distress HEENT:  Normocephalic, atraumatic, oropharynx patent. EOMI, PERRLA, fundi exam grossly normal, conjunctival erythematous edema, minimally purulent discharge, no eye swelling. No light sensitivity. Cardiovascular:  Regular rate and rhythm, no rubs murmurs or gallops.  No Carotid bruits, radial pulse intact. No pedal edema.  Respiratory: Clear to auscultation bilaterally.  No wheezes, rales, or rhonchi.  No cyanosis, no use of accessory musculature Abdominal: No organomegaly, abdomen is soft and non-tender, positive bowel sounds. No masses. Skin: No rashes. Neurologic: Facial musculature symmetric. Psychiatric: Patient acts appropriately throughout our interaction. Lymphatic: No cervical or submandibular lymphadenopathy Musculoskeletal: Gait intact. No  edema, tenderness   LABS: Results for orders placed or performed during the hospital encounter of 08/01/14  Pregnancy, urine  Result Value Ref Range   Preg Test, Ur NEGATIVE NEGATIVE  CBC  Result Value Ref Range   WBC 13.9 (H) 4.0 - 10.5 K/uL   RBC 3.65 (L) 3.87 - 5.11 MIL/uL   Hemoglobin 11.5 (L) 12.0 - 15.0 g/dL   HCT 33.1 (L) 36.0 - 46.0 %   MCV 90.7 78.0 - 100.0 fL   MCH 31.5 26.0 - 34.0 pg   MCHC 34.7 30.0 - 36.0 g/dL   RDW 12.1 11.5 - 15.5 %   Platelets 193 150 - 400 K/uL  Type and screen  Result Value Ref Range   ABO/RH(D) O NEG    Antibody Screen NEG    Sample Expiration 08/04/2014      EKG/XRAY:   Primary read interpreted by Dr. Marin Comment at [facility].   ASSESSMENT/PLAN: Encounter Diagnosis  Name Primary?  . Bilateral conjunctivitis    Continue gentamicin, start TobraDex Follow up As needed  Gross sideeffects, risk and benefits, and alternatives of medications d/w patient. Patient is aware that all medications have potential sideeffects and we are unable to predict every sideeffect or drug-drug interaction that may occur.  Alba Kriesel DO  01/25/2015 7:00 PM

## 2015-01-25 NOTE — Telephone Encounter (Signed)
Spoke with pt, advised to RTC. Pt understood.

## 2015-04-17 ENCOUNTER — Other Ambulatory Visit: Payer: Self-pay | Admitting: Obstetrics & Gynecology

## 2015-05-07 ENCOUNTER — Ambulatory Visit: Admit: 2015-05-07 | Payer: BLUE CROSS/BLUE SHIELD | Admitting: Obstetrics & Gynecology

## 2015-05-07 SURGERY — LAPAROSCOPY OPERATIVE
Anesthesia: Choice

## 2015-06-11 ENCOUNTER — Other Ambulatory Visit: Payer: Self-pay | Admitting: Obstetrics and Gynecology

## 2015-06-11 LAB — HM PAP SMEAR

## 2015-06-12 LAB — CYTOLOGY - PAP

## 2015-06-19 ENCOUNTER — Ambulatory Visit: Admit: 2015-06-19 | Payer: BLUE CROSS/BLUE SHIELD | Admitting: Obstetrics & Gynecology

## 2015-06-19 SURGERY — LAPAROSCOPY OPERATIVE
Anesthesia: Choice

## 2015-12-13 ENCOUNTER — Encounter: Payer: Self-pay | Admitting: Family Medicine

## 2015-12-13 ENCOUNTER — Ambulatory Visit (INDEPENDENT_AMBULATORY_CARE_PROVIDER_SITE_OTHER): Payer: BLUE CROSS/BLUE SHIELD | Admitting: Family Medicine

## 2015-12-13 VITALS — BP 114/72 | HR 69 | Ht 62.75 in | Wt 150.3 lb

## 2015-12-13 DIAGNOSIS — F39 Unspecified mood [affective] disorder: Secondary | ICD-10-CM

## 2015-12-13 DIAGNOSIS — R519 Headache, unspecified: Secondary | ICD-10-CM | POA: Insufficient documentation

## 2015-12-13 DIAGNOSIS — L720 Epidermal cyst: Secondary | ICD-10-CM | POA: Diagnosis not present

## 2015-12-13 DIAGNOSIS — R51 Headache: Secondary | ICD-10-CM

## 2015-12-13 DIAGNOSIS — E663 Overweight: Secondary | ICD-10-CM | POA: Diagnosis not present

## 2015-12-13 NOTE — Progress Notes (Signed)
Marjory Sneddon, D.O. Family Medicine Physician [facility] Location: Primary Care at [facility]     Subjective:    CC: New pt, here to establish care.   HPI: Veronica Day is a pleasant 50 y.o. female who presents to [place] at [facility] today To establish care.   "Skin lesions"- 4 of concern for patient today.- one on R inner, upper  thigh, one on chest, one on post R shoulder, L lat chest wall.  Skin colored- no changes, occ itch. Patient has seen [facility] in the past for a large inclusion cyst on her back which had to be removed.  She's always had benign skin lesions but worries and says she has a family history of cancer.  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  Has 2 children and 3 biological grandchildren and 2 step grandchildren.   Mood: One year on paxil- only med she's been on for mood. Helps with depressed feelings and feeling anxious. Put on by GYN post-Hysterectomy when she was thrown into early menopause.       Past Medical History  Diagnosis Date  . Allergy   . Anemia     h/o with pregnancy  . Menorrhagia with irregular cycle 04/03/2014  . Headache     MIGRAINES  . PONV (postoperative nausea and vomiting)     Past Surgical History  Procedure Laterality Date  . Tubal ligation    . Laparoscopy N/A 05/12/2013    Procedure: LAPAROSCOPY DIAGNOSTIC, BIOPSY OF PERITONEUM, DRAINAGE OF RIGHT FOLLICULAR CYST;  Surgeon: Princess Bruins, MD;  Location: [place] [facility];  Service: Gynecology;  Laterality: N/A;  . Laparoscopic assisted vaginal hysterectomy N/A 08/01/2014    Procedure: LAPAROSCOPIC ASSISTED VAGINAL HYSTERECTOMY;  Surgeon: Sanjuana Kava, MD;  Location: [place] [facility];  Service: Gynecology;  Laterality: N/A;  . Laparoscopic bilateral salpingo oopherectomy Bilateral 08/01/2014    Procedure: LAPAROSCOPIC BILATERAL SALPINGO OOPHORECTOMY;  Surgeon: Sanjuana Kava, MD;  Location: [facility];  Service: Gynecology;   Laterality: Bilateral;  . Cystoscopy N/A 08/01/2014    Procedure: CYSTOSCOPY;  Surgeon: Sanjuana Kava, MD;  Location: [facility];  Service: Gynecology;  Laterality: N/A;  . Abdominal hysterectomy      Family History  Problem Relation Age of Onset  . Thyroid disease Mother   . Hyperlipidemia Mother   . Heart disease Father   . Thyroid disease Sister   . Diabetes Sister   . Hypertension Sister   . Hypertension Brother   . Stroke Brother   . Diabetes Brother     History  Drug Use No  ,  History  Alcohol Use  . 0.0 oz/week  . 0 Standard drinks or equivalent per week    Comment: occasionally  ,  History  Smoking status  . Current Every Day Smoker -- 1.00 packs/day for 20 years  . Types: Cigarettes  Smokeless tobacco  . Never Used  ,  History  Sexual Activity  . Sexual Activity: Yes    Comment: hysterectomy    Patient's Medications  New Prescriptions   No medications on file  Previous Medications   ACETAMINOPHEN (TYLENOL) 325 MG TABLET    Take 650 mg by mouth every 6 (six) hours as needed.   ASCORBIC ACID (VITAMIN C PO)    Take 1 tablet by mouth daily.   IBUPROFEN (ADVIL,MOTRIN) 800 MG TABLET    Take 1 tablet (800 mg total) by mouth every 8 (eight) hours as needed (mild  pain).   PAROXETINE (PAXIL) 10 MG TABLET    Take 1 tablet by mouth daily.  Modified Medications   No medications on file  Discontinued Medications   FE FUM-FE POLY-VIT C-LACTOBAC (FUSION PO)    Take 1 tablet by mouth daily.   GENTAMICIN (GARAMYCIN) 0.3 % OPHTHALMIC SOLUTION    Place 2 drops into the left eye every 4 (four) hours.   PAROXETINE MESYLATE (BRISDELLE) 7.5 MG CAPS    Take 7.5 mg by mouth daily.    TOBRAMYCIN-DEXAMETHASONE (TOBRADEX) OPHTHALMIC SOLUTION    Place 2 drops into both eyes every 6 (six) hours.    ALLERGIES: Codeine and Tramadol   Review of Systems: Full 14 point ROS performed via "adult medical history form".  Negative except for noted above and HA, difficulty hearing/ringing in  ears    Objective:   Blood pressure 114/72, pulse 69, height 5' 2.75" (1.594 m), weight 150 lb 4.8 oz (68.176 kg), last menstrual period 04/27/2014. Body mass index is 26.83 kg/(m^2).  General: Well Developed, well nourished, and in no acute distress.  Neuro: Alert and oriented x3, extra-ocular muscles intact, sensation grossly intact.  HEENT: Normocephalic, atraumatic, pupils equal round reactive to light, neck supple, no gross masses, no carotid bruits, no JVD apprec Skin: Anterior chest T4 region with hard subcutaneous flesh-colored nodule consistent with inclusion cyst, 3 mm.  Right posterior shoulder and R inner, upper thigh both with 4 mm subcutaneous hard nodule, appears desurfaced from patient scratching. L lateral chest wall- 38mm skin colored sub-q papule Cardiac: Regular rate and rhythm, no murmurs rubs or gallops.  Respiratory: Essentially clear to auscultation bilaterally. Not using accessory muscles, speaking in full sentences.  Abdominal: Soft, not grossly distended Musculoskeletal: Ambulates w/o diff, FROM * 4 ext.  Vasc: less 2 sec cap RF, warm and pink  Psych:  No HI/SI, judgement and insight good.    Impression and Recommendations:    The patient was counselled, risk factors were discussed, anticipatory guidance given.   Mood disorder ([place]) Continue Paxil started by GYN doctor. Patient at the end of visit also mentions dyspareunia which she will discuss with her GYN doctor at the next visit.  Inclusion cyst Discussed with patient the ABCDs of skin and to look for changes. She will follow up if she notices any. Do not scratch or manipulate skin lesions. Patient understands they may be there forever and may grow.  Overweight (BMI 25.0-29.9) Nutrition and exercise counseling performed. Strongly recommended fasting labs in near future as I explained being overweight puts her at risk for certain diseases.   Note: This document was prepared using Dragon voice  recognition software and may include unintentional dictation errors.

## 2015-12-13 NOTE — Assessment & Plan Note (Signed)
Discussed with patient the ABCDs of skin and to look for changes. She will follow up if she notices any. Do not scratch or manipulate skin lesions. Patient understands they may be there forever and may grow.

## 2015-12-13 NOTE — Patient Instructions (Signed)
Come in for fasting blood work at [facility] in near [place].  CBC, CMP, FLP, A1c, TSH, Vit D, B12  Actinic Keratosis Actinic keratosis is a precancerous growth on the skin. This means it could develop into skin cancer if it is not treated. About 1% of actinic keratoses turn into skin cancer within a year. It is important to have all such growths removed to prevent them from developing into skin cancer. CAUSES  Actinic keratosis is caused by getting too much ultraviolet (UV) radiation from the sun or other UV light sources. RISK FACTORS Factors that increase your chances of getting actinic keratosis include:  Having light-colored skin and blue eyes.  Having blonde or red hair.  Spending a lot of time in the sun.  Age. The risk of actinic keratosis increases with age. SYMPTOMS  Actinic keratosis growths look like scaly, rough spots of skin. They can be as small as a pinhead or as big as a quarter. They may itch, hurt, or feel sensitive. Sometimes there is a little tag of pink or gray skin growing off them. In some cases, actinic keratoses are easier felt than seen. They do not go away with the use of moisturizing lotions or creams. Actinic keratoses appear most often on areas of skin that get a lot of sun exposure. These areas include the:  Scalp.  Face.  Ears.  Lips.  Upper back.  Backs of the hands.  Forearms. DIAGNOSIS  Your health care provider can usually tell what is wrong by performing a physical exam. A tissue sample (biopsy) may also be taken and examined under a microscope. TREATMENT  Actinic keratosis can be treated several ways. Most treatments can be done in your health care provider's office. Treatment options may include:  Curettage. A tool is used to gently scrape off the growth.  Cryosurgery. Liquid nitrogen is applied to the growth to freeze it. The growth eventually falls off the skin.  Medicated creams, such as 5-fluorouracil or imiquimod. The medicine  destroys the cells in the growth.  Chemical peels. Chemicals are applied to the growth and the outer layers of skin are peeled off.  Photodynamic therapy. A drug that makes your skin more sensitive to light is applied to the skin. A strong, blue light is aimed at the skin and destroys the growth. PREVENTION  To prevent future sun damage:  Try to avoid the sun between 10:00 a.m. and 4:00 p.m. when it is the strongest.  Use a sunscreen or sunblock with SPF 30 or greater.  Apply sunscreen at least 30 minutes before exposure to the sun.  Always wear protective hats, clothing, and sunglasses with UV protection.  Avoid medicines, herbs, and foods that increase your sensitivity to sunlight.  Avoid tanning beds. HOME CARE INSTRUCTIONS   If your skin was covered with a bandage, change and remove the bandage as directed by your health care provider.  Keep the treated area dry as directed by your health care provider.  Apply any creams as prescribed by your health care provider. Follow the directions carefully.  Check your skin regularly for any changes.  Visit a skin doctor (dermatologist) every year for a skin exam. SEEK MEDICAL CARE IF:   Your skin does not heal and becomes irritated, red, or bleeds.  You notice any changes or new growths on your skin.   This information is not intended to replace advice given to you by your health care provider. Make sure you discuss any questions you have  with your health care provider.   Document Released: 09/26/2008 Document Revised: 07/21/2014 Document Reviewed: 08/11/2011 Elsevier Interactive Patient Education [employer].

## 2015-12-13 NOTE — Assessment & Plan Note (Signed)
Continue Paxil started by GYN doctor. Patient at the end of visit also mentions dyspareunia which she will discuss with her GYN doctor at the next visit.

## 2015-12-13 NOTE — Assessment & Plan Note (Addendum)
Nutrition and exercise counseling performed. Strongly recommended fasting labs in near future as I explained being overweight puts her at risk for certain diseases.

## 2015-12-18 ENCOUNTER — Other Ambulatory Visit (INDEPENDENT_AMBULATORY_CARE_PROVIDER_SITE_OTHER): Payer: BLUE CROSS/BLUE SHIELD

## 2015-12-18 DIAGNOSIS — E663 Overweight: Secondary | ICD-10-CM

## 2015-12-18 DIAGNOSIS — Z0000 Encounter for general adult medical examination without abnormal findings: Secondary | ICD-10-CM

## 2015-12-19 LAB — CBC WITH DIFFERENTIAL/PLATELET
BASOS ABS: 0 {cells}/uL (ref 0–200)
BASOS PCT: 0 %
EOS ABS: 153 {cells}/uL (ref 15–500)
EOS PCT: 3 %
HEMATOCRIT: 40.9 % (ref 35.0–45.0)
Hemoglobin: 13.9 g/dL (ref 11.7–15.5)
Lymphocytes Relative: 28 %
Lymphs Abs: 1428 cells/uL (ref 850–3900)
MCH: 30.5 pg (ref 27.0–33.0)
MCHC: 34 g/dL (ref 32.0–36.0)
MCV: 89.7 fL (ref 80.0–100.0)
MONOS PCT: 10 %
MPV: 10.4 fL (ref 7.5–12.5)
Monocytes Absolute: 510 cells/uL (ref 200–950)
NEUTROS PCT: 59 %
Neutro Abs: 3009 cells/uL (ref 1500–7800)
Platelets: 222 10*3/uL (ref 140–400)
RBC: 4.56 MIL/uL (ref 3.80–5.10)
RDW: 13.2 % (ref 11.0–15.0)
WBC: 5.1 10*3/uL (ref 3.8–10.8)

## 2015-12-19 LAB — COMPREHENSIVE METABOLIC PANEL
ALT: 12 U/L (ref 6–29)
AST: 15 U/L (ref 10–35)
Albumin: 4.2 g/dL (ref 3.6–5.1)
Alkaline Phosphatase: 67 U/L (ref 33–115)
BILIRUBIN TOTAL: 0.5 mg/dL (ref 0.2–1.2)
BUN: 15 mg/dL (ref 7–25)
CO2: 31 mmol/L (ref 20–31)
Calcium: 9.4 mg/dL (ref 8.6–10.2)
Chloride: 103 mmol/L (ref 98–110)
Creat: 0.62 mg/dL (ref 0.50–1.10)
GLUCOSE: 82 mg/dL (ref 65–99)
Potassium: 4 mmol/L (ref 3.5–5.3)
Sodium: 139 mmol/L (ref 135–146)
Total Protein: 6.9 g/dL (ref 6.1–8.1)

## 2015-12-19 LAB — LIPID PANEL
CHOL/HDL RATIO: 3.3 ratio (ref <=5.0)
CHOLESTEROL: 176 mg/dL (ref 125–200)
HDL: 54 mg/dL (ref >=46)
LDL Cholesterol: 96 mg/dL (ref <130)
TRIGLYCERIDES: 129 mg/dL (ref <150)
VLDL: 26 mg/dL (ref <30)

## 2015-12-19 LAB — TSH: TSH: 1.88 mIU/L

## 2015-12-19 LAB — HEMOGLOBIN A1C
Hgb A1c MFr Bld: 4.9 % (ref <5.7)
MEAN PLASMA GLUCOSE: 94 mg/dL

## 2015-12-19 LAB — VITAMIN D 25 HYDROXY (VIT D DEFICIENCY, FRACTURES): VIT D 25 HYDROXY: 37 ng/mL (ref 30–100)

## 2016-06-19 LAB — HM MAMMOGRAPHY

## 2017-09-08 ENCOUNTER — Ambulatory Visit (INDEPENDENT_AMBULATORY_CARE_PROVIDER_SITE_OTHER): Payer: Self-pay | Admitting: Family Medicine

## 2017-09-08 ENCOUNTER — Encounter: Payer: Self-pay | Admitting: Family Medicine

## 2017-09-08 VITALS — BP 109/75 | HR 99 | Temp 98.3°F | Ht 63.0 in | Wt 132.0 lb

## 2017-09-08 DIAGNOSIS — B9789 Other viral agents as the cause of diseases classified elsewhere: Secondary | ICD-10-CM

## 2017-09-08 DIAGNOSIS — J329 Chronic sinusitis, unspecified: Secondary | ICD-10-CM

## 2017-09-08 DIAGNOSIS — J069 Acute upper respiratory infection, unspecified: Secondary | ICD-10-CM

## 2017-09-08 DIAGNOSIS — D259 Leiomyoma of uterus, unspecified: Secondary | ICD-10-CM | POA: Insufficient documentation

## 2017-09-08 DIAGNOSIS — R52 Pain, unspecified: Secondary | ICD-10-CM

## 2017-09-08 DIAGNOSIS — N939 Abnormal uterine and vaginal bleeding, unspecified: Secondary | ICD-10-CM | POA: Insufficient documentation

## 2017-09-08 DIAGNOSIS — N951 Menopausal and female climacteric states: Secondary | ICD-10-CM | POA: Insufficient documentation

## 2017-09-08 DIAGNOSIS — J310 Chronic rhinitis: Secondary | ICD-10-CM

## 2017-09-08 DIAGNOSIS — N941 Unspecified dyspareunia: Secondary | ICD-10-CM | POA: Insufficient documentation

## 2017-09-08 DIAGNOSIS — R198 Other specified symptoms and signs involving the digestive system and abdomen: Secondary | ICD-10-CM | POA: Insufficient documentation

## 2017-09-08 LAB — POCT INFLUENZA A/B
Influenza A, POC: NEGATIVE
Influenza B, POC: NEGATIVE

## 2017-09-08 NOTE — Progress Notes (Signed)
Acute Care Office visit  Assessment and plan:  1. Viral URI with cough   2. Body aches   3. Rhinosinusitis    - Viral vs Allergic vs Bacterial causes for pt's symptoms reveiwed.    - Supportive care and various OTC medications discussed in addition to any prescribed. - Call or RTC if new symptoms, or if no improvement or worse over next several days.   - Will consider ABX if sx continue past 10 days and worsening if not already given.    - Discussed the nature of viral infection at length with the patient. - She understands that this will take time to resolve.  - If sx last more than 7-10 days and worsening, she will call us and return for follow-up. - she may also call in for further attention if she doesn't feel any better after over 10 days.  - Use tylenol cold & sinus, advil cold & sinus OTC for relief.  Push fluids.   - Pt should rest when she needs to rest, and make time to take care of herself.  "Listen to her body."  - she cannot go to work if running fevers - must be fever free for 24 hours.  - Recommended the patient to get a thermometer to measure her temperature accurately at home.     Orders Placed This Encounter  Procedures  . POCT Influenza A/B    Gross side effects, risk and benefits, and alternatives of medications discussed with patient.  Patient is aware that all medications have potential side effects and we are unable to predict every sideeffect or drug-drug interaction that may occur.  Expresses verbal understanding and consents to current therapy plan and treatment regiment.   Education and routine counseling performed. Handouts provided.  Anticipatory guidance and routine counseling done re: condition, txmnt options and need for follow up. All questions of patient's were answered.  Return if symptoms worsen or fail to improve, for Follow-up with PCP near future and/or as instructed last OV with her.  Please see AVS handed out to patient at the  end of our visit for additional patient instructions/ counseling done pertaining to today's office visit.  Note: This document was partially repared using Dragon voice recognition software and may include unintentional dictation errors.  This document serves as a record of services personally performed by Mellody Dance, DO. It was created on her behalf by Toni Amend, a trained medical scribe. The creation of this record is based on the scribe's personal observations and the provider's statements to them.   I have reviewed the above medical documentation for accuracy and completeness and I concur.  Mellody Dance 09/22/17 6:05 PM    Subjective:    Chief Complaint  Patient presents with  . Sinus Problem    sinus infection, fevers, sore throat, bodyaches, headache, non productive cough x 3  days     HPI:  Pt presents with Sx for the past 3 days.  Today is day 4.  Works at [employer] in [employer], [employer].  Co-worker had the flu recently, boss had it a month ago, his girlfriend before that.   C/o: Sneezing, runny nose, blowing her nose, itchy watery eyes, coughing, sore throat.   Woke up with it Saturday (4 days ago).  Has gotten worse.  Believes she has been running a fever.  Very tired, and couldn't get out of bed. Doesn't have a thermometer, but woke up twice drenched in the night - clothes  and all soaking wet.  Denies: One-sided face pain.    For symptoms patient has tried:  Mucinex.  Last took tylenol/advil yesterday.  Overall getting:   Feeling worse.   Patient Care Team    Relationship Specialty Notifications Start End  Mellody Dance, DO PCP - General Family Medicine  12/13/15     Past medical history, Surgical history, Family history reviewed and noted below, Social history, Allergies, and Medications have been entered into the medical record, reviewed and changed as needed.   Allergies  Allergen Reactions  . Codeine Nausea And Vomiting  . Tramadol Nausea  And Vomiting    Review of Systems: - see above HPI for pertinent positives General:   No F/C, wt loss Pulm:   No DIB, pleuritic chest pain Card:  No CP, palpitations Abd:  No n/v/d or pain Ext:  No inc edema from baseline   Objective:   Blood pressure 109/75, pulse 99, temperature 98.3 F (36.8 C), height 5\' 3"  (1.6 m), weight 132 lb (59.9 kg), last menstrual period 04/27/2014, SpO2 98 %. Body mass index is 23.38 kg/m. General: Well Developed, well nourished, appropriate for stated age.  Neuro: Alert and oriented x3, extra-ocular muscles intact, sensation grossly intact.  HEENT: Normocephalic, atraumatic, pupils equal round reactive to light, neck supple, no masses, no painful lymphadenopathy, TM's intact B/L, no acute findings - both ears. Nares- patent, clear d/c, OP- clear, mild erythema, No TTP sinuses Skin: Warm and dry, no gross rash. Cardiac: RRR, S1 S2,  no murmurs rubs or gallops.  Respiratory: ECTA B/L and A/P, Not using accessory muscles, speaking in full sentences- unlabored. Vascular:  No gross lower ext edema, cap RF less 2 sec. Psych: No HI/SI, judgement and insight good, Euthymic mood. Full Affect.

## 2017-09-08 NOTE — Patient Instructions (Signed)
  Symptoms for a upper respiratory tract infection usually last 3-7 days but can stretch out to 2-3 weeks before you're feeling back to normal.  Your symptoms should not worsen after 7-10 days and if they do, please notify our office, as you may need additional evaluation.  You can use over-the-counter afrin nasal spray for up to 3 days (NO longer than that) which will help acutely with nasal drainage/ congestion short term.   Also, sterile saline nasal rinses, such as Milta Deiters med or AYR sinus rinses, can be very helpful and should be done twice daily- especially throughout the allergy season.   Remember you should use distilled water or previously boiled water to do this.  Then you may use over-the-counter Flonase 1 spray each nostril twice daily after sinus rinses.   You can also use an over the counter cold and flu medication such as Tylenol Severe Cold and Sinus/Flu or Dayquil, Nyquil and the like, which will help with cough, congestion, headache/ pain, fevers/chills etc.  Please note, if you being treated for hypertension or have high blood pressure, you should be using the cold meds designated "HBP".  Also Delsym over-the-counter is great for cough as well as Robitussin-DM and others.  Wash your hands frequently, as you did not want to get those around you sick as well. Never sneeze or cough on others.  And you should not be going to school or work if you are running a temperature of 100.5 or more on two separate occasions.   Drink plenty of fluids and stay hydrated, especially if you are running fevers.  We don't know why, but chicken soup also helps, try it! :)

## 2018-12-31 ENCOUNTER — Emergency Department (INDEPENDENT_AMBULATORY_CARE_PROVIDER_SITE_OTHER)
Admission: EM | Admit: 2018-12-31 | Discharge: 2018-12-31 | Disposition: A | Discharge status: Home / Self Care | Payer: Worker's Compensation | Source: Home / Self Care | Attending: Family Medicine | Admitting: Family Medicine

## 2018-12-31 ENCOUNTER — Emergency Department (INDEPENDENT_AMBULATORY_CARE_PROVIDER_SITE_OTHER): Payer: Self-pay

## 2018-12-31 ENCOUNTER — Other Ambulatory Visit: Payer: Self-pay

## 2018-12-31 ENCOUNTER — Encounter: Payer: Self-pay | Admitting: *Deleted

## 2018-12-31 DIAGNOSIS — M549 Dorsalgia, unspecified: Secondary | ICD-10-CM

## 2018-12-31 DIAGNOSIS — S39012A Strain of muscle, fascia and tendon of lower back, initial encounter: Secondary | ICD-10-CM

## 2018-12-31 MED ORDER — CYCLOBENZAPRINE HCL 10 MG PO TABS: 10.0000 mg | ORAL_TABLET | Freq: Three times a day (TID) | ORAL | 1 refills | Status: AC | PRN

## 2018-12-31 NOTE — Discharge Instructions (Addendum)
Apply ice pack for 20 to 30 minutes, 3 to 4 times daily  Continue until pain and swelling decrease.  May take Ibuprofen 200mg , 4 tabs every 8 hours with food.  May take Tylenol as needed for pain. Begin range of motion and stretching exercises as tolerated.

## 2018-12-31 NOTE — ED Triage Notes (Signed)
Pt c/o mid to low back pain post fall at work 2 days ago. She has taken Tylenol with minimal relief.

## 2018-12-31 NOTE — ED Provider Notes (Signed)
[facility]    CSN: [id number] Arrival date & time: 12/31/18  1524     History   Chief Complaint Chief Complaint  Patient presents with  . Back Pain    HPI Ver[facility] Day is a 53 y.o. female.   Patient fell down stairs two days ago, injuring her back.  She has had persistent non-radiating lower back pain.   She denies bowel or bladder dysfunction, and no saddle numbness.   The history is provided by the patient.  Back Pain Location:  Lumbar spine Quality:  Aching Radiates to:  Does not radiate Pain severity:  Moderate Pain is:  Same all the time Onset quality:  Sudden Duration:  2 days Timing:  Constant Progression:  Unchanged Chronicity:  New Context: falling   Relieved by:  Nothing Worsened by:  Twisting and movement Ineffective treatments: Tylenol. Associated symptoms: no abdominal pain, no bladder incontinence, no bowel incontinence, no dysuria, no leg pain, no numbness, no paresthesias, no pelvic pain, no perianal numbness, no tingling and no weakness     Past Medical History:  Diagnosis Date  . Allergy   . Anemia    h/o with pregnancy  . Headache    MIGRAINES  . Menorrhagia with irregular cycle 04/03/2014  . PONV (postoperative nausea and vomiting)     Patient Active Problem List   Diagnosis Date Noted  . Dyspareunia in female 09/08/2017  . Abnormal uterine bleeding 09/08/2017  . Menopausal syndrome 09/08/2017  . Pain associated with defecation 09/08/2017  . Uterine leiomyoma 09/08/2017  . Mood disorder ([place]) 12/13/2015  . Inclusion cyst 12/13/2015  . Headache 12/13/2015  . Overweight (BMI 25.0-29.9) 12/13/2015  . S/P laparoscopic assisted vaginal hysterectomy (LAVH) 08/01/2014  . Menorrhagia with irregular cycle 04/03/2014    Past Surgical History:  Procedure Laterality Date  . ABDOMINAL HYSTERECTOMY    . CYSTOSCOPY N/A 08/01/2014   Procedure: CYSTOSCOPY;  Surgeon: Sanjuana Kava, MD;  Location: [facility];  Service: Gynecology;   Laterality: N/A;  . LAPAROSCOPIC ASSISTED VAGINAL HYSTERECTOMY N/A 08/01/2014   Procedure: LAPAROSCOPIC ASSISTED VAGINAL HYSTERECTOMY;  Surgeon: Sanjuana Kava, MD;  Location: [facility];  Service: Gynecology;  Laterality: N/A;  . LAPAROSCOPIC BILATERAL SALPINGO OOPHERECTOMY Bilateral 08/01/2014   Procedure: LAPAROSCOPIC BILATERAL SALPINGO OOPHORECTOMY;  Surgeon: Sanjuana Kava, MD;  Location: [facility];  Service: Gynecology;  Laterality: Bilateral;  . LAPAROSCOPY N/A 05/12/2013   Procedure: LAPAROSCOPY DIAGNOSTIC, BIOPSY OF PERITONEUM, DRAINAGE OF RIGHT FOLLICULAR CYST;  Surgeon: Princess Bruins, MD;  Location: [facility];  Service: Gynecology;  Laterality: N/A;  . TUBAL LIGATION      OB History   No obstetric history on file.      Home Medications    Prior to Admission medications   Medication Sig Start Date End Date Taking? Authorizing Provider  acetaminophen (TYLENOL) 500 MG tablet Take 500 mg by mouth every 6 (six) hours as needed.   Yes [provider]  cetirizine (ZYRTEC) 10 MG tablet Take 10 mg by mouth daily.   Yes [provider]  cholecalciferol (VITAMIN D3) 25 MCG (1000 UT) tablet Take 1,000 Units by mouth daily.   Yes [provider]  MULTIPLE VITAMIN PO Take by mouth.   Yes [provider]  cyclobenzaprine (FLEXERIL) 10 MG tablet Take 1 tablet (10 mg total) by mouth 3 (three) times daily as needed for muscle spasms. 12/31/18   Kandra Nicolas, MD  Estradiol (VAGIFEM) 10 MCG TABS vaginal tablet Vagifem 10 mcg vaginal tablet  Insert  1 tablet(s) EVERY DAY by vaginal route for 14 days at bedtime then 2x/wk at bedtime    [provider]  norethindrone (AYGESTIN) 5 MG tablet norethindrone acetate 5 mg tablet    [provider]    Family History Family History  Problem Relation Age of Onset  . Thyroid disease Mother   . Hyperlipidemia Mother   . Heart disease Father   . Thyroid disease Sister   . Diabetes Sister   . Hypertension Sister    . Hypertension Brother   . Stroke Brother   . Diabetes Brother     Social History Social History   Tobacco Use  . Smoking status: Current Every Day Smoker    Packs/day: 1.00    Years: 20.00    Pack years: 20.00    Types: Cigarettes  . Smokeless tobacco: Never Used  Substance Use Topics  . Alcohol use: Yes    Alcohol/week: 0.0 standard drinks    Comment: occasionally  . Drug use: No     Allergies   Codeine and Tramadol   Review of Systems Review of Systems  Gastrointestinal: Negative for abdominal pain and bowel incontinence.  Genitourinary: Negative for bladder incontinence, dysuria and pelvic pain.  Musculoskeletal: Positive for back pain.  Neurological: Negative for tingling, weakness, numbness and paresthesias.  All other systems reviewed and are negative.    Physical Exam Triage Vital Signs ED Triage Vitals  Enc Vitals Group     BP 12/31/18 1554 109/72     Pulse Rate 12/31/18 1554 77     Resp 12/31/18 1554 18     Temp 12/31/18 1554 97.8 F (36.6 C)     Temp Source 12/31/18 1554 Oral     SpO2 12/31/18 1554 98 %     Weight 12/31/18 1556 138 lb (62.6 kg)     Height 12/31/18 1556 5\' 3"  (1.6 m)     Head Circumference --      Peak Flow --      Pain Score 12/31/18 1555 5     Pain Loc --      Pain Edu? --      Excl. in [place]? --    No data found.  Updated Vital Signs BP 109/72 (BP Location: Right Arm)   Pulse 77   Temp 97.8 F (36.6 C) (Oral)   Resp 18   Ht 5\' 3"  (1.6 m)   Wt 62.6 kg   LMP 04/27/2014   SpO2 98%   BMI 24.45 kg/m   Visual Acuity Right Eye Distance:   Left Eye Distance:   Bilateral Distance:    Right Eye Near:   Left Eye Near:    Bilateral Near:     Physical Exam Vitals signs and nursing note reviewed.  Constitutional:      General: She is not in acute distress. HENT:     Head: Normocephalic.     Right Ear: External ear normal.     Left Ear: External ear normal.     Nose: Nose normal.     Mouth/Throat:     Mouth:  Mucous membranes are moist.  Eyes:     Conjunctiva/sclera: Conjunctivae normal.     Pupils: Pupils are equal, round, and reactive to light.  Neck:     Musculoskeletal: Normal range of motion.  Cardiovascular:     Heart sounds: Normal heart sounds.  Pulmonary:     Breath sounds: Normal breath sounds.  Abdominal:     Tenderness: There is no  abdominal tenderness.  Musculoskeletal:        General: No signs of injury.     Lumbar back: She exhibits decreased range of motion, tenderness and bony tenderness. She exhibits no swelling.       Back:     Comments: Back:   Can heel/toe walk and squat without difficulty.  Decreased forward flexion.  Tenderness in the midline and bilateral paraspinous muscles from approximately L1 to Sacral area.  Straight leg raising test is negative.  Sitting knee extension test is negative.  Strength and sensation in the lower extremities is normal.  Patellar and achilles reflexes are normal   Skin:    General: Skin is warm and dry.     Findings: No rash.  Neurological:     Mental Status: She is alert.      UC Treatments / Results  Labs (all labs ordered are listed, but only abnormal results are displayed) Labs Reviewed - No data to display  EKG None  Radiology Dg Lumbar Spine Complete  Result Date: 12/31/2018 CLINICAL DATA:  [place] downstairs 2 days ago.  Back pain. EXAM: LUMBAR SPINE - COMPLETE 4+ VIEW COMPARISON:  None. FINDINGS: Normal alignment of the lumbar vertebral bodies. Disc spaces and vertebral bodies are maintained. The facets are normally aligned. No pars defects. The visualized bony pelvis is intact. IMPRESSION: Normal alignment and no acute bony findings or significant degenerative changes. Electronically Signed   By: Marijo Sanes M.D.   On: 12/31/2018 16:32    Procedures Procedures (including critical care time)  Medications Ordered in UC Medications - No data to display  Initial Impression / Assessment and Plan / UC Course  I have  reviewed the triage vital signs and the nursing notes.  Pertinent labs & imaging results that were available during my care of the patient were reviewed by me and considered in my medical decision making (see chart for details).    Rx for Flexeril 10mg  TID prn. Followup with Dr. Aundria Mems or Dr. Lynne Leader ([facility]) if not improving about two weeks.    Final Clinical Impressions(s) / UC Diagnoses   Final diagnoses:  Low back strain, initial encounter     Discharge Instructions     Apply ice pack for 20 to 30 minutes, 3 to 4 times daily  Continue until pain and swelling decrease.  May take Ibuprofen 200mg , 4 tabs every 8 hours with food.  May take Tylenol as needed for pain. Begin range of motion and stretching exercises as tolerated.    ED Prescriptions    Medication Sig Dispense Auth. Provider   cyclobenzaprine (FLEXERIL) 10 MG tablet Take 1 tablet (10 mg total) by mouth 3 (three) times daily as needed for muscle spasms. 20 tablet Kandra Nicolas, MD         Kandra Nicolas, MD 01/05/19 [phone]

## 2019-01-28 ENCOUNTER — Encounter: Payer: Self-pay | Admitting: Sports Medicine

## 2019-01-28 ENCOUNTER — Ambulatory Visit (INDEPENDENT_AMBULATORY_CARE_PROVIDER_SITE_OTHER): Payer: Worker's Compensation | Admitting: Sports Medicine

## 2019-01-28 ENCOUNTER — Other Ambulatory Visit: Payer: Self-pay

## 2019-01-28 DIAGNOSIS — M5416 Radiculopathy, lumbar region: Secondary | ICD-10-CM

## 2019-01-28 DIAGNOSIS — G5601 Carpal tunnel syndrome, right upper limb: Secondary | ICD-10-CM

## 2019-01-28 MED ORDER — PREDNISONE 50 MG PO TABS: ORAL_TABLET | ORAL | 0 refills | Status: DC

## 2019-01-28 NOTE — Assessment & Plan Note (Addendum)
We will start with nighttime splinting. Return to see me in a month, hydrodissection if no better.

## 2019-01-28 NOTE — Assessment & Plan Note (Signed)
Left L5 distribution. Prednisone, MRI, formal PT. This is under [employer]. Return to see me in 1 month, MRI if no better.

## 2019-01-28 NOTE — Progress Notes (Addendum)
Subjective:    CC: Low back pain  HPI:  This is a pleasant 53 year old female, she slipped and fell at work about a month ago 12/29/2018, she had severe pain in her low back radiating down the left leg to the middle toes, she also has pain in her forearm, with numbness and tingling into the hands in the fingertips.  Symptoms are moderate, persistent, she has had greater than 4 weeks of physician directed conservative measures, she is however having some weakness in her leg.  I reviewed the past medical history, family history, social history, surgical history, and allergies today and no changes were needed.  Please see the problem list section below in epic for further details.  Past Medical History: Past Medical History:  Diagnosis Date  . Allergy   . Anemia    h/o with pregnancy  . Headache    MIGRAINES  . Menorrhagia with irregular cycle 04/03/2014  . PONV (postoperative nausea and vomiting)    Past Surgical History: Past Surgical History:  Procedure Laterality Date  . ABDOMINAL HYSTERECTOMY    . CYSTOSCOPY N/A 08/01/2014   Procedure: CYSTOSCOPY;  Surgeon: Sanjuana Kava, MD;  Location: [facility];  Service: Gynecology;  Laterality: N/A;  . LAPAROSCOPIC ASSISTED VAGINAL HYSTERECTOMY N/A 08/01/2014   Procedure: LAPAROSCOPIC ASSISTED VAGINAL HYSTERECTOMY;  Surgeon: Sanjuana Kava, MD;  Location: [facility];  Service: Gynecology;  Laterality: N/A;  . LAPAROSCOPIC BILATERAL SALPINGO OOPHERECTOMY Bilateral 08/01/2014   Procedure: LAPAROSCOPIC BILATERAL SALPINGO OOPHORECTOMY;  Surgeon: Sanjuana Kava, MD;  Location: [facility];  Service: Gynecology;  Laterality: Bilateral;  . LAPAROSCOPY N/A 05/12/2013   Procedure: LAPAROSCOPY DIAGNOSTIC, BIOPSY OF PERITONEUM, DRAINAGE OF RIGHT FOLLICULAR CYST;  Surgeon: Princess Bruins, MD;  Location: [facility];  Service: Gynecology;  Laterality: N/A;  . TUBAL LIGATION     Social History: Social History   Socioeconomic History  . Marital status: Divorced    Spouse name: Not  on file  . Number of children: Not on file  . Years of education: Not on file  . Highest education level: Not on file  Occupational History  . Not on file  Tobacco Use  . Smoking status: Current Every Day Smoker    Packs/day: 1.00    Years: 20.00    Pack years: 20.00    Types: Cigarettes  . Smokeless tobacco: Never Used  Substance and Sexual Activity  . Alcohol use: Yes    Alcohol/week: 0.0 standard drinks    Comment: occasionally  . Drug use: No  . Sexual activity: Yes    Comment: hysterectomy  Other Topics Concern  . Not on file  Social History Narrative  . Not on file   Social Determinants of Health   Financial Resource Strain:   . Difficulty of Paying Living Expenses: Not on file  Food Insecurity:   . Worried About Charity fundraiser in the Last Year: Not on file  . Ran Out of Food in the Last Year: Not on file  Transportation Needs:   . Lack of Transportation (Medical): Not on file  . Lack of Transportation (Non-Medical): Not on file  Physical Activity:   . Days of Exercise per Week: Not on file  . Minutes of Exercise per Session: Not on file  Stress:   . Feeling of Stress : Not on file  Social Connections:   . Frequency of Communication with Friends and Family: Not on file  . Frequency of Social Gatherings with Friends and Family: Not on file  .  Attends Religious Services: Not on file  . Active Member of Clubs or Organizations: Not on file  . Attends Archivist Meetings: Not on file  . Marital Status: Not on file   Family History: Family History  Problem Relation Age of Onset  . Thyroid disease Mother   . Hyperlipidemia Mother   . Heart disease Father   . Thyroid disease Sister   . Diabetes Sister   . Hypertension Sister   . Hypertension Brother   . Stroke Brother   . Diabetes Brother    Allergies: Allergies  Allergen Reactions  . Codeine Nausea And Vomiting  . Tramadol Nausea And Vomiting   Medications: See med rec.  Review of  Systems: No headache, visual changes, nausea, vomiting, diarrhea, constipation, dizziness, abdominal pain, skin rash, fevers, chills, night sweats, swollen lymph nodes, weight loss, chest pain, body aches, joint swelling, muscle aches, shortness of breath, mood changes, visual or auditory hallucinations.  Objective:    General: Well Developed, well nourished, and in no acute distress.  Neuro: Alert and oriented x3, extra-ocular muscles intact, sensation grossly intact.  HEENT: Normocephalic, atraumatic, pupils equal round reactive to light, neck supple, no masses, no lymphadenopathy, thyroid nonpalpable.  Skin: Warm and dry, no rashes noted.  Cardiac: Regular rate and rhythm, no murmurs rubs or gallops.  Respiratory: Clear to auscultation bilaterally. Not using accessory muscles, speaking in full sentences.  Abdominal: Soft, nontender, nondistended, positive bowel sounds, no masses, no organomegaly.  Back Exam:  Inspection: Unremarkable  Motion: Flexion 45 deg, Extension 45 deg, Side Bending to 45 deg bilaterally,  Rotation to 45 deg bilaterally  SLR laying: Negative  XSLR laying: Negative  Palpable tenderness: Left paralumbar muscles. FABER: negative. Sensory change: Gross sensation intact to all lumbar and sacral dermatomes.  Reflexes: 2+ at both patellar tendons, 2+ at achilles tendons, Babinski's downgoing.  Strength at foot  Plantar-flexion: 5/5 Dorsi-flexion: 5/5 Eversion: 5/5 Inversion: 5/5  Leg strength  Quad: 5/5 Hamstring: 5/5 Hip flexor: 5/5 Hip abductors: 5/5  Gait unremarkable. Right Wrist: Inspection normal with no visible erythema or swelling. ROM smooth and normal with good flexion and extension and ulnar/radial deviation that is symmetrical with opposite wrist. Palpation is normal over metacarpals, navicular, lunate, and TFCC; tendons without tenderness/ swelling No snuffbox tenderness. No tenderness over Canal of [place]. Strength 5/5 in all directions without pain.  Positive tinel's and phalens signs. Negative Finkelstein sign. Negative Watson's test.  Lumbar spine x-rays personally reviewed, she has multilevel lumbar degenerative disc disease  Impression and Recommendations:    The patient was counselled, risk factors were discussed, anticipatory guidance given.  Left lumbar radiculopathy Left L5 distribution. Prednisone, MRI, formal PT. This is under [employer] Return to see me in 1 month, MRI if no better.  Carpal tunnel syndrome on right We will start with nighttime splinting. Return to see me in a month, hydrodissection if no better.   ___________________________________________ Gwen Her. Dianah Field, M.D., ABFM., CAQSM. Primary Care and Sports Medicine [facility]  Adjunct Professor of [employer]

## 2019-01-31 ENCOUNTER — Encounter: Payer: Self-pay | Admitting: Sports Medicine

## 2019-02-07 ENCOUNTER — Ambulatory Visit (INDEPENDENT_AMBULATORY_CARE_PROVIDER_SITE_OTHER): Payer: Worker's Compensation

## 2019-02-07 ENCOUNTER — Other Ambulatory Visit: Payer: Self-pay

## 2019-02-07 DIAGNOSIS — M5416 Radiculopathy, lumbar region: Secondary | ICD-10-CM

## 2019-02-15 ENCOUNTER — Ambulatory Visit: Payer: Self-pay | Admitting: Rehabilitative and Restorative Service Providers"

## 2019-02-15 ENCOUNTER — Other Ambulatory Visit: Payer: Self-pay

## 2019-02-18 ENCOUNTER — Ambulatory Visit: Payer: Self-pay | Admitting: Sports Medicine

## 2019-06-30 ENCOUNTER — Encounter: Payer: Self-pay | Admitting: Sports Medicine

## 2019-06-30 ENCOUNTER — Ambulatory Visit (INDEPENDENT_AMBULATORY_CARE_PROVIDER_SITE_OTHER): Payer: Worker's Compensation | Admitting: Sports Medicine

## 2019-06-30 ENCOUNTER — Other Ambulatory Visit: Payer: Self-pay

## 2019-06-30 DIAGNOSIS — M5416 Radiculopathy, lumbar region: Secondary | ICD-10-CM | POA: Diagnosis not present

## 2019-06-30 DIAGNOSIS — G5601 Carpal tunnel syndrome, right upper limb: Secondary | ICD-10-CM | POA: Diagnosis not present

## 2019-06-30 MED ORDER — TRAMADOL HCL 50 MG PO TABS: 50.0000 mg | ORAL_TABLET | Freq: Two times a day (BID) | ORAL | 0 refills | Status: AC | PRN

## 2019-06-30 NOTE — Assessment & Plan Note (Signed)
Persistent left L5 distribution radiculitis, did not respond to prednisone, formal PT. This is still under [employer]. MRI does show some areas of only minimal foraminal stenosis. At this point we are going to proceed with a left L5-S1 interlaminar epidural, she would like to schedule this after Christmas.

## 2019-06-30 NOTE — Progress Notes (Signed)
Subjective:    CC: Follow-up  HPI: Left lumbar radiculitis: Persistent discomfort.  Right carpal tunnel syndrome: No better in spite of nighttime splinting.  I reviewed the past medical history, family history, social history, surgical history, and allergies today and no changes were needed.  Please see the problem list section below in epic for further details.  Past Medical History: Past Medical History:  Diagnosis Date  . Allergy   . Anemia    h/o with pregnancy  . Headache    MIGRAINES  . Menorrhagia with irregular cycle 04/03/2014  . PONV (postoperative nausea and vomiting)    Past Surgical History: Past Surgical History:  Procedure Laterality Date  . ABDOMINAL HYSTERECTOMY    . CYSTOSCOPY N/A 08/01/2014   Procedure: CYSTOSCOPY;  Surgeon: Sanjuana Kava, MD;  Location: [facility];  Service: Gynecology;  Laterality: N/A;  . LAPAROSCOPIC ASSISTED VAGINAL HYSTERECTOMY N/A 08/01/2014   Procedure: LAPAROSCOPIC ASSISTED VAGINAL HYSTERECTOMY;  Surgeon: Sanjuana Kava, MD;  Location: [facility];  Service: Gynecology;  Laterality: N/A;  . LAPAROSCOPIC BILATERAL SALPINGO OOPHERECTOMY Bilateral 08/01/2014   Procedure: LAPAROSCOPIC BILATERAL SALPINGO OOPHORECTOMY;  Surgeon: Sanjuana Kava, MD;  Location: [facility];  Service: Gynecology;  Laterality: Bilateral;  . LAPAROSCOPY N/A 05/12/2013   Procedure: LAPAROSCOPY DIAGNOSTIC, BIOPSY OF PERITONEUM, DRAINAGE OF RIGHT FOLLICULAR CYST;  Surgeon: Princess Bruins, MD;  Location: [facility];  Service: Gynecology;  Laterality: N/A;  . TUBAL LIGATION     Social History: Social History   Socioeconomic History  . Marital status: Divorced    Spouse name: Not on file  . Number of children: Not on file  . Years of education: Not on file  . Highest education level: Not on file  Occupational History  . Not on file  Tobacco Use  . Smoking status: Current Every Day Smoker    Packs/day: 1.00    Years: 20.00    Pack years: 20.00    Types: Cigarettes  . Smokeless  tobacco: Never Used  Substance and Sexual Activity  . Alcohol use: Yes    Alcohol/week: 0.0 standard drinks    Comment: occasionally  . Drug use: No  . Sexual activity: Yes    Comment: hysterectomy  Other Topics Concern  . Not on file  Social History Narrative  . Not on file   Social Determinants of Health   Financial Resource Strain:   . Difficulty of Paying Living Expenses: Not on file  Food Insecurity:   . Worried About Charity fundraiser in the Last Year: Not on file  . Ran Out of Food in the Last Year: Not on file  Transportation Needs:   . Lack of Transportation (Medical): Not on file  . Lack of Transportation (Non-Medical): Not on file  Physical Activity:   . Days of Exercise per Week: Not on file  . Minutes of Exercise per Session: Not on file  Stress:   . Feeling of Stress : Not on file  Social Connections:   . Frequency of Communication with Friends and Family: Not on file  . Frequency of Social Gatherings with Friends and Family: Not on file  . Attends Religious Services: Not on file  . Active Member of Clubs or Organizations: Not on file  . Attends Archivist Meetings: Not on file  . Marital Status: Not on file   Family History: Family History  Problem Relation Age of Onset  . Thyroid disease Mother   . Hyperlipidemia Mother   . Heart disease Father   .  Thyroid disease Sister   . Diabetes Sister   . Hypertension Sister   . Hypertension Brother   . Stroke Brother   . Diabetes Brother    Allergies: Allergies  Allergen Reactions  . Codeine Nausea And Vomiting  . Tramadol Nausea And Vomiting   Medications: See med rec.  Review of Systems: No fevers, chills, night sweats, weight loss, chest pain, or shortness of breath.   Objective:    General: Well Developed, well nourished, and in no acute distress.  Neuro: Alert and oriented x3, extra-ocular muscles intact, sensation grossly intact.  HEENT: Normocephalic, atraumatic, pupils equal  round reactive to light, neck supple, no masses, no lymphadenopathy, thyroid nonpalpable.  Skin: Warm and dry, no rashes. Cardiac: Regular rate and rhythm, no murmurs rubs or gallops, no lower extremity edema.  Respiratory: Clear to auscultation bilaterally. Not using accessory muscles, speaking in full sentences.  Impression and Recommendations:    Left lumbar radiculopathy Persistent left L5 distribution radiculitis, did not respond to prednisone, formal PT. This is still under Worker's Comp. MRI does show some areas of only minimal foraminal stenosis. At this point we are going to proceed with a left L5-S1 interlaminar epidural, she would like to schedule this after Christmas.  Carpal tunnel syndrome on right Failed nighttime splinting, we are also going to proceed with median nerve hydrodissection but she would also need to do this after Christmas. She will schedule at her leisure.  I spent 25 minutes with this patient, greater than 50% was face-to-face time counseling regarding the above diagnoses.  ___________________________________________ Gwen Her. Dianah Field, M.D., ABFM., CAQSM. Primary Care and Sports Medicine [facility]  Adjunct Professor of [place] of [facility] of Medicine

## 2019-06-30 NOTE — Assessment & Plan Note (Signed)
Failed nighttime splinting, we are also going to proceed with median nerve hydrodissection but she would also need to do this after Christmas. She will schedule at her leisure.

## 2019-07-11 ENCOUNTER — Other Ambulatory Visit: Payer: Self-pay

## 2019-07-11 ENCOUNTER — Ambulatory Visit (INDEPENDENT_AMBULATORY_CARE_PROVIDER_SITE_OTHER): Payer: Self-pay | Admitting: Sports Medicine

## 2019-07-11 DIAGNOSIS — G5601 Carpal tunnel syndrome, right upper limb: Secondary | ICD-10-CM

## 2019-07-11 NOTE — Progress Notes (Signed)
Subjective:    CC: Right hand numbness and tingling  HPI: Venous returns, she continues to have numbness and tingling in her right hand, moderate, persistent, worse at night.  Night splinting has not been effective.  I reviewed the past medical history, family history, social history, surgical history, and allergies today and no changes were needed.  Please see the problem list section below in epic for further details.  Past Medical History: Past Medical History:  Diagnosis Date  . Allergy   . Anemia    h/o with pregnancy  . Headache    MIGRAINES  . Menorrhagia with irregular cycle 04/03/2014  . PONV (postoperative nausea and vomiting)    Past Surgical History: Past Surgical History:  Procedure Laterality Date  . ABDOMINAL HYSTERECTOMY    . CYSTOSCOPY N/A 08/01/2014   Procedure: CYSTOSCOPY;  Surgeon: Sanjuana Kava, MD;  Location: [facility];  Service: Gynecology;  Laterality: N/A;  . LAPAROSCOPIC ASSISTED VAGINAL HYSTERECTOMY N/A 08/01/2014   Procedure: LAPAROSCOPIC ASSISTED VAGINAL HYSTERECTOMY;  Surgeon: Sanjuana Kava, MD;  Location: [facility];  Service: Gynecology;  Laterality: N/A;  . LAPAROSCOPIC BILATERAL SALPINGO OOPHERECTOMY Bilateral 08/01/2014   Procedure: LAPAROSCOPIC BILATERAL SALPINGO OOPHORECTOMY;  Surgeon: Sanjuana Kava, MD;  Location: [facility];  Service: Gynecology;  Laterality: Bilateral;  . LAPAROSCOPY N/A 05/12/2013   Procedure: LAPAROSCOPY DIAGNOSTIC, BIOPSY OF PERITONEUM, DRAINAGE OF RIGHT FOLLICULAR CYST;  Surgeon: Princess Bruins, MD;  Location: [facility];  Service: Gynecology;  Laterality: N/A;  . TUBAL LIGATION     Social History: Social History   Socioeconomic History  . Marital status: Divorced    Spouse name: Not on file  . Number of children: Not on file  . Years of education: Not on file  . Highest education level: Not on file  Occupational History  . Not on file  Tobacco Use  . Smoking status: Current Every Day Smoker    Packs/day: 1.00    Years: 20.00   Pack years: 20.00    Types: Cigarettes  . Smokeless tobacco: Never Used  Substance and Sexual Activity  . Alcohol use: Yes    Alcohol/week: 0.0 standard drinks    Comment: occasionally  . Drug use: No  . Sexual activity: Yes    Comment: hysterectomy  Other Topics Concern  . Not on file  Social History Narrative  . Not on file   Social Determinants of Health   Financial Resource Strain:   . Difficulty of Paying Living Expenses: Not on file  Food Insecurity:   . Worried About Charity fundraiser in the Last Year: Not on file  . Ran Out of Food in the Last Year: Not on file  Transportation Needs:   . Lack of Transportation (Medical): Not on file  . Lack of Transportation (Non-Medical): Not on file  Physical Activity:   . Days of Exercise per Week: Not on file  . Minutes of Exercise per Session: Not on file  Stress:   . Feeling of Stress : Not on file  Social Connections:   . Frequency of Communication with Friends and Family: Not on file  . Frequency of Social Gatherings with Friends and Family: Not on file  . Attends Religious Services: Not on file  . Active Member of Clubs or Organizations: Not on file  . Attends Archivist Meetings: Not on file  . Marital Status: Not on file   Family History: Family History  Problem Relation Age of Onset  . Thyroid disease Mother   .  Hyperlipidemia Mother   . Heart disease Father   . Thyroid disease Sister   . Diabetes Sister   . Hypertension Sister   . Hypertension Brother   . Stroke Brother   . Diabetes Brother    Allergies: Allergies  Allergen Reactions  . Codeine Nausea And Vomiting  . Tramadol Nausea And Vomiting   Medications: See med rec.  Review of Systems: No fevers, chills, night sweats, weight loss, chest pain, or shortness of breath.   Objective:    General: Well Developed, well nourished, and in no acute distress.  Neuro: Alert and oriented x3, extra-ocular muscles intact, sensation grossly  intact.  HEENT: Normocephalic, atraumatic, pupils equal round reactive to light, neck supple, no masses, no lymphadenopathy, thyroid nonpalpable.  Skin: Warm and dry, no rashes. Cardiac: Regular rate and rhythm, no murmurs rubs or gallops, no lower extremity edema.  Respiratory: Clear to auscultation bilaterally. Not using accessory muscles, speaking in full sentences.  Procedure: Real-time Ultrasound Guided hydrodissection of the right median nerve at the carpal tunnel Device: Samsung HS60 Verbal informed consent obtained.  Time-out conducted.  Noted no overlying erythema, induration, or other signs of local infection.  Skin prepped in a sterile fashion.  Local anesthesia: Topical Ethyl chloride.  With sterile technique and under real time ultrasound guidance: Using a 25-gauge needle advanced into the carpal tunnel, taking care to avoid intraneural injection I injected medication both superficial to and deep to the median nerve freeing it from surrounding structures, I then redirected the needle deep and injected further medication around the flexor tendons deep within the carpal tunnel for a total of 1 cc kenalog 40, 5 cc 1% lidocaine without epinephrine. Completed without difficulty  Advised to call if fevers/chills, erythema, induration, drainage, or persistent bleeding.  Images permanently stored and available for review in the ultrasound unit.  Impression: Technically successful ultrasound guided median nerve hydrodissection.  Impression and Recommendations:    Carpal tunnel syndrome on right Right-sided median nerve hydrodissection under ultrasound guidance, return in 1 month.   ___________________________________________ Gwen Her. Dianah Field, M.D., ABFM., CAQSM. Primary Care and Sports Medicine [facility]  Adjunct Professor of [place] of [facility] of Medicine

## 2019-07-11 NOTE — Assessment & Plan Note (Signed)
Right-sided median nerve hydrodissection under ultrasound guidance, return in 1 month.

## 2019-07-25 ENCOUNTER — Inpatient Hospital Stay: Admission: RE | Admit: 2019-07-25 | Payer: Self-pay | Source: Ambulatory Visit

## 2019-08-11 ENCOUNTER — Other Ambulatory Visit: Payer: Self-pay

## 2019-08-11 ENCOUNTER — Ambulatory Visit (INDEPENDENT_AMBULATORY_CARE_PROVIDER_SITE_OTHER): Payer: Self-pay | Admitting: Sports Medicine

## 2019-08-11 DIAGNOSIS — G5601 Carpal tunnel syndrome, right upper limb: Secondary | ICD-10-CM

## 2019-08-11 NOTE — Assessment & Plan Note (Signed)
Veronica Day returns, at the last visit I performed a right-sided median nerve hydrodissection with ultrasound guidance. She is doing a lot better, symptom-free. Unfortunately she has not been wearing her nighttime splint, I did encourage her to continue to wear this as this will represent the maintenance treatment. She is going to try to find a low-profile more comfortable splint in the meantime. I am also going to have her do the carpal tunnel stretches, and she can return to see me on an as-needed basis. It has been a pleasure taking care of this patient.

## 2019-08-11 NOTE — Progress Notes (Signed)
    Procedures performed today:    None.  Independent interpretation of tests performed by another provider:   None.  Impression and Recommendations:    Carpal tunnel syndrome on right Veronica Day returns, at the last visit I performed a right-sided median nerve hydrodissection with ultrasound guidance. She is doing a lot better, symptom-free. Unfortunately she has not been wearing her nighttime splint, I did encourage her to continue to wear this as this will represent the maintenance treatment. She is going to try to find a low-profile more comfortable splint in the meantime. I am also going to have her do the carpal tunnel stretches, and she can return to see me on an as-needed basis. It has been a pleasure taking care of this patient.    ___________________________________________ Gwen Her. Dianah Field, M.D., ABFM., CAQSM. Primary Care and  Instructor of [place] of [facility] of Medicine

## 2020-02-08 ENCOUNTER — Encounter: Payer: Self-pay | Admitting: Physician Assistant

## 2021-01-02 ENCOUNTER — Emergency Department (HOSPITAL_BASED_OUTPATIENT_CLINIC_OR_DEPARTMENT_OTHER)
Admission: EM | Admit: 2021-01-02 | Discharge: 2021-01-02 | Disposition: A | Discharge status: Home / Self Care | Payer: Self-pay | Attending: Emergency Medicine | Admitting: Emergency Medicine

## 2021-01-02 ENCOUNTER — Emergency Department (HOSPITAL_BASED_OUTPATIENT_CLINIC_OR_DEPARTMENT_OTHER): Payer: Self-pay

## 2021-01-02 ENCOUNTER — Other Ambulatory Visit: Payer: Self-pay

## 2021-01-02 ENCOUNTER — Encounter (HOSPITAL_BASED_OUTPATIENT_CLINIC_OR_DEPARTMENT_OTHER): Payer: Self-pay

## 2021-01-02 DIAGNOSIS — F17210 Nicotine dependence, cigarettes, uncomplicated: Secondary | ICD-10-CM | POA: Insufficient documentation

## 2021-01-02 DIAGNOSIS — J181 Lobar pneumonia, unspecified organism: Secondary | ICD-10-CM | POA: Insufficient documentation

## 2021-01-02 DIAGNOSIS — R079 Chest pain, unspecified: Secondary | ICD-10-CM

## 2021-01-02 DIAGNOSIS — J189 Pneumonia, unspecified organism: Secondary | ICD-10-CM

## 2021-01-02 MED ORDER — MELOXICAM 7.5 MG PO TABS: 7.5000 mg | ORAL_TABLET | Freq: Every day | ORAL | 0 refills | Status: AC

## 2021-01-02 MED ORDER — LIDOCAINE 5 % EX PTCH: 1.0000 | MEDICATED_PATCH | CUTANEOUS | 0 refills | Status: AC

## 2021-01-02 MED ORDER — LIDOCAINE 5 % EX PTCH
1.0000 | MEDICATED_PATCH | CUTANEOUS | Status: DC
  Administered 2021-01-02: 1 via TRANSDERMAL
  Filled 2021-01-02: qty 1

## 2021-01-02 MED ORDER — AZITHROMYCIN 250 MG PO TABS: 250.0000 mg | ORAL_TABLET | Freq: Every day | ORAL | 0 refills | Status: AC

## 2021-01-02 MED ORDER — AMOXICILLIN 500 MG PO CAPS: 1000.0000 mg | ORAL_CAPSULE | Freq: Three times a day (TID) | ORAL | 0 refills | Status: AC

## 2021-01-02 NOTE — Discharge Instructions (Addendum)
Stop the cefdinir, and start taking the amoxicillin and azithromycin as prescribed. Take the entire course, even if symptoms improve. Stop taking the naproxen, start taking the Mobic daily instead. Continue symptomatic treatment with the Lidoderm patch, cough syrup, and muscle relaxer as needed. Return to the emergency room if you develop high fevers, worsening pain, difficulty breathing, blood in your sputum, or any new, worsening, or concerning symptoms.

## 2021-01-02 NOTE — ED Triage Notes (Signed)
Pt was in [place] at [facility] and was dx with bronchitis, then called today to inform pt to come to ED d/t misdiagnosis. States is actually pneumonia and a "pulmonary protrusion". Been tested for covid x 4 with negative results.   Had flu 2 weeks ago. Still coughing, pain to right ribs.

## 2021-01-02 NOTE — ED Provider Notes (Signed)
[facility] EMER[facility]ENC[facility] DEPARTMENT Provider Note   CSN: [id number] Arrival date & time: 01/02/21  1819     History Chief Complaint  Patient presents with   Pneumonia    Veronica Day is a 55 y.o. female presenting for evaluation of right-sided chest pain and cough.  Patient states she has been ill for the past 2 weeks.  She states she was diagnosed with flu.  A few days ago, patient coughed and felt something pop on her right side.  Since then, she has had worsening right-sided pain.  Pain is worse with movement and deep breaths.  She went to an urgent care yesterday, was diagnosed with bronchitis, but when the radiologist read the x-rays today they were concerned for pneumonia versus pulmonary contusion.  Patient denies any trauma or injury.  She is not having any fevers.  She was discharged from the urgent care on cough syrup, Flexeril, cefdinir, and naproxen.  She has been taking these as prescribed.  She did have a recent drive to and from [place], 8-hour drive, however pain began immediately after coughing.  No leg pain or swelling.  HPI     Past Medical History:  Diagnosis Date   Allergy    Anemia    h/o with pregnancy   Headache    MIGRAINES   Menorrhagia with irregular cycle 04/03/2014   PONV (postoperative nausea and vomiting)     Patient Active Problem List   Diagnosis Date Noted   Left lumbar radiculopathy 01/28/2019   Carpal tunnel syndrome on right 01/28/2019   Dyspareunia in female 09/08/2017   Abnormal uterine bleeding 09/08/2017   Menopausal syndrome 09/08/2017   Pain associated with defecation 09/08/2017   Uterine leiomyoma 09/08/2017   Mood disorder ([place]) 12/13/2015   Inclusion cyst 12/13/2015   Headache 12/13/2015   Overweight (BMI 25.0-29.9) 12/13/2015   S/P laparoscopic assisted vaginal hysterectomy (LAVH) 08/01/2014   Menorrhagia with irregular cycle 04/03/2014    Past Surgical History:  Procedure Laterality Date   ABDOMINAL  HYSTERECTOMY     CYSTOSCOPY N/A 08/01/2014   Procedure: CYSTOSCOPY;  Surgeon: Sanjuana Kava, MD;  Location: [facility];  Service: Gynecology;  Laterality: N/A;   LAPAROSCOPIC ASSISTED VAGINAL HYSTERECTOMY N/A 08/01/2014   Procedure: LAPAROSCOPIC ASSISTED VAGINAL HYSTERECTOMY;  Surgeon: Sanjuana Kava, MD;  Location: [facility];  Service: Gynecology;  Laterality: N/A;   LAPAROSCOPIC BILATERAL SALPINGO OOPHERECTOMY Bilateral 08/01/2014   Procedure: LAPAROSCOPIC BILATERAL SALPINGO OOPHORECTOMY;  Surgeon: Sanjuana Kava, MD;  Location: [facility];  Service: Gynecology;  Laterality: Bilateral;   LAPAROSCOPY N/A 05/12/2013   Procedure: LAPAROSCOPY DIAGNOSTIC, BIOPSY OF PERITONEUM, DRAINAGE OF RIGHT FOLLICULAR CYST;  Surgeon: Princess Bruins, MD;  Location: [facility];  Service: Gynecology;  Laterality: N/A;   TUBAL LIGATION       OB History   No obstetric history on file.     Family History  Problem Relation Age of Onset   Thyroid disease Mother    Hyperlipidemia Mother    Heart disease Father    Thyroid disease Sister    Diabetes Sister    Hypertension Sister    Hypertension Brother    Stroke Brother    Diabetes Brother     Social History   Tobacco Use   Smoking status: Every Day    Packs/day: 1.00    Years: 20.00    Pack years: 20.00    Types: Cigarettes   Smokeless tobacco: Never  Substance Use Topics   Alcohol use: Yes  Alcohol/week: 0.0 standard drinks    Comment: occasionally   Drug use: No    Home Medications Prior to Admission medications   Medication Sig Start Date End Date Taking? Authorizing Provider  amoxicillin (AMOXIL) 500 MG capsule Take 2 capsules (1,000 mg total) by mouth 3 (three) times daily for 7 days. 01/02/21 01/09/21 Yes Sayeed Weatherall, PA-C  azithromycin (ZITHROMAX) 250 MG tablet Take 1 tablet (250 mg total) by mouth daily. Take first 2 tablets together, then 1 every day until finished. 01/02/21  Yes Davan Hark, PA-C  lidocaine (LIDODERM) 5 % Place 1 patch onto the  skin daily. Remove & Discard patch within 12 hours or as directed by MD 01/02/21  Yes Tharun Cappella, PA-C  meloxicam (MOBIC) 7.5 MG tablet Take 1 tablet (7.5 mg total) by mouth daily. 01/02/21  Yes Pauleen Goleman, PA-C  acetaminophen (TYLENOL) 500 MG tablet Take 500 mg by mouth every 6 (six) hours as needed.    [provider]  cetirizine (ZYRTEC) 10 MG tablet Take 10 mg by mouth daily.    [provider]  cholecalciferol (VITAMIN D3) 25 MCG (1000 UT) tablet Take 1,000 Units by mouth daily.    [provider]  cyclobenzaprine (FLEXERIL) 10 MG tablet Take 1 tablet (10 mg total) by mouth 3 (three) times daily as needed for muscle spasms. 12/31/18   Kandra Nicolas, MD  Estradiol (VAGIFEM) 10 MCG TABS vaginal tablet Vagifem 10 mcg vaginal tablet  Insert 1 tablet(s) EVERY DAY by vaginal route for 14 days at bedtime then 2x/wk at bedtime    [provider]  MULTIPLE VITAMIN PO Take by mouth.    [provider]  norethindrone (AYGESTIN) 5 MG tablet norethindrone acetate 5 mg tablet    [provider]  traMADol (ULTRAM) 50 MG tablet Take 1 tablet (50 mg total) by mouth every 12 (twelve) hours as needed for moderate pain. Maximum 6 tabs per day. 06/30/19   Silverio Decamp, MD    Allergies    Codeine, Naproxen, and Tramadol  Review of Systems   Review of Systems  Respiratory:  Positive for cough.   Cardiovascular:  Positive for chest pain (R sided rib/chest pain).  All other systems reviewed and are negative.  Physical Exam Updated Vital Signs BP 117/74   Pulse 89   Temp 98.7 F (37.1 C) (Oral)   Resp 16   Ht 5\' 3"  (1.6 m)   Wt 64.8 kg   LMP 04/27/2014   SpO2 99%   BMI 25.31 kg/m   Physical Exam Vitals and nursing note reviewed.  Constitutional:      General: She is not in acute distress.    Appearance: Normal appearance.     Comments: Appears nontoxic  HENT:     Head: Normocephalic and atraumatic.  Eyes:      Conjunctiva/sclera: Conjunctivae normal.     Pupils: Pupils are equal, round, and reactive to light.  Cardiovascular:     Rate and Rhythm: Normal rate and regular rhythm.     Pulses: Normal pulses.  Pulmonary:     Effort: Pulmonary effort is normal. No respiratory distress.     Breath sounds: Rales present. No wheezing.     Comments: Speaking in full sentences.  Coarse breath sounds in right lower lobe.  SPO2 stable on room air.  Tenderness palpation of the right anterior, lateral, posterior chest wall.  No rash. Chest:     Chest wall: Tenderness present.  Abdominal:  General: There is no distension.     Palpations: Abdomen is soft. There is no mass.     Tenderness: There is no abdominal tenderness. There is no guarding or rebound.  Musculoskeletal:        General: Normal range of motion.     Cervical back: Normal range of motion and neck supple.     Right lower leg: No edema.     Left lower leg: No edema.  Skin:    General: Skin is warm and dry.     Capillary Refill: Capillary refill takes less than 2 seconds.  Neurological:     Mental Status: She is alert and oriented to person, place, and time.  Psychiatric:        Mood and Affect: Mood and affect normal.        Speech: Speech normal.        Behavior: Behavior normal.    ED Results / Procedures / Treatments   Labs (all labs ordered are listed, but only abnormal results are displayed) Labs Reviewed - No data to display  EKG None  Radiology DG Chest 2 View  Result Date: 01/02/2021 CLINICAL DATA:  Pneumonia EXAM: CHEST - 2 VIEW COMPARISON:  None. FINDINGS: Probable small pleural effusions. Ground-glass opacity and consolidation in the peripheral right lung base suspicious for pneumonia. Small amount of right middle lobe ground-glass opacity. Normal heart size. No pneumothorax IMPRESSION: Right middle and lower lobes ground-glass opacity and mild consolidation consistent with pneumonia. Possible tiny pleural effusion  Electronically Signed   By: Donavan Foil M.D.   On: 01/02/2021 19:03    Procedures Procedures   Medications Ordered in ED Medications  lidocaine (LIDODERM) 5 % 1 patch (1 patch Transdermal Patch Applied 01/02/21 1938)    ED Course  I have reviewed the triage vital signs and the nursing notes.  Pertinent labs & imaging results that were available during my care of the patient were reviewed by me and considered in my medical decision making (see chart for details).    MDM Rules/Calculators/A&P                          Patient presenting for evaluation of right-sided chest pain.  On exam, patient peers nontoxic.  Pain is reproducible with palpation of the chest wall, began with patient was coughing.  Likely muscle pain.  She also has pneumonia on the right side, chills likely contributing to her pain.  While she did have recent trip, her pain began after coughing and she has no calf pain or tenderness.  Low suspicion for PE.  X-ray obtained from triage viewed and independently interpreted by me, shows right middle and lower lobe pneumonia.  Consistent with patient's symptoms.  Discussed findings with patient and daughter.  Discussed continued symptomatic treatment.  We will switch patient's antibiotics to standard CAP treatment. At this time, pt appears safe for d/c. Return precautions given. Pt states she understands and agrees to plan.   Final Clinical Impression(s) / ED Diagnoses Final diagnoses:  Community acquired pneumonia of right lower lobe of lung  Right-sided chest pain    Rx / DC Orders ED Discharge Orders          Ordered    lidocaine (LIDODERM) 5 %  Every 24 hours        01/02/21 1923    azithromycin (ZITHROMAX) 250 MG tablet  Daily        01/02/21 1923  amoxicillin (AMOXIL) 500 MG capsule  3 times daily        01/02/21 1923    meloxicam (MOBIC) 7.5 MG tablet  Daily        01/02/21 1924             Franchot Heidelberg, PA-C 01/02/21 [place], [place],  DO 01/02/21 2304

## 2024-06-15 ENCOUNTER — Ambulatory Visit
Admission: EM | Admit: 2024-06-15 | Discharge: 2024-06-15 | Disposition: A | Discharge status: Home / Self Care | Payer: Self-pay

## 2024-06-15 ENCOUNTER — Encounter: Payer: Self-pay | Admitting: Emergency Medicine

## 2024-06-15 DIAGNOSIS — J101 Influenza due to other identified influenza virus with other respiratory manifestations: Secondary | ICD-10-CM

## 2024-06-15 DIAGNOSIS — H60391 Other infective otitis externa, right ear: Secondary | ICD-10-CM

## 2024-06-15 LAB — POC COVID19/FLU A&B COMBO
Covid Antigen, POC: NEGATIVE
Influenza A Antigen, POC: POSITIVE — AB
Influenza B Antigen, POC: NEGATIVE

## 2024-06-15 MED ORDER — OSELTAMIVIR PHOSPHATE 75 MG PO CAPS: 75.0000 mg | ORAL_CAPSULE | Freq: Two times a day (BID) | ORAL | 0 refills | Status: AC

## 2024-06-15 MED ORDER — CIPROFLOXACIN-DEXAMETHASONE 0.3-0.1 % OT SUSP: 4.0000 [drp] | Freq: Two times a day (BID) | OTIC | 0 refills | Status: AC

## 2024-06-15 NOTE — Discharge Instructions (Addendum)
 Tamiflu  works best if taken within the first 3 days of symptoms, you are on day 4. Taking it would not hurt but may not work as effectively as would have if taken within the first 3 days.  You been diagnosed with a viral illness today. -Viruses have to run their course and medicines that are prescribed are meant to help with symptoms. - With viruses usually feel poorly from 3 to 7 days with cough being the last symptoms to resolve.  -Cough can linger from days to weeks.  Antibiotics are not effective for viruses. -If your cough lasts more than 2 weeks and you are coughing so hard that you are vomiting or feel like you could pass out we need to follow-up with PCP for further testing and evaluation. -Rest, increase water  intake, may use pseudoephedrine for nasal congestion, Delsym (dextromethorphan) or honey as needed for cough, and ibuprofen  and/or Tylenol  as directed on packaging for pain and fever. -If you have hypertension you should take Coricidin or other OTC meds approved for people with high blood pressure. -You may use a spoonful of honey every 4-6 hours as needed for throat pain and cough. -Warm tea with honey and lemon are helpful for soothe throat as well.  Chloraseptic and Cepacol make a throat lozenge with numbing medication, can be purchased over-the-counter. -May also use Flonase or sinus rinse for sinus pressure or nasal congestion.  Be sure to use distilled bottled water  for sinus rinses. -May use coolmist humidifier to open up nasal passages -May elevate head to assist with postnasal drainage. -If you feel poorly (fever, fatigue, shortness of breath, nausea, etc.) for more than 10 days to be sure to follow-up with PCP or in clinic for further evaluation and additional treatments. If you experience chest pain with shortness of breath or pulse oxygen less than 95% you should report to the ER.

## 2024-06-15 NOTE — ED Triage Notes (Addendum)
 Pt presents c/o flu sxs x 4 days. Pt states,  Sun and Mon I had vomiting and a touch of diarrhea. Yesterday I felt a little better. Today I have had diarrhea like crazy and lots of sinus pressure. I also have the sinuses, my right ear hurts and I even think I had a fever. My niece had the same sxs and she was dx with strep on yesterday   Pt is requesting Covid and Flu testing due to caring for her elderly parents.

## 2024-06-15 NOTE — ED Provider Notes (Signed)
 [facility]    CSN: [id number] Arrival date & time: 06/15/24  1439      History   Chief Complaint Chief Complaint  Patient presents with   Influenza    HPI Veronica Day is a 58 y.o. female.   Patient presents today due to feeling poorly for the last 4 days.  Patient states that she has been experiencing nausea and vomiting, about 4 episodes of each daily.  Patient states that she has been having sinus pressure, nasal congestion, and ear pain.  Patient states that she was in contact with exam was positive for strep.  The history is provided by the patient.  Influenza   Past Medical History:  Diagnosis Date   Allergy    Anemia    h/o with pregnancy   Headache    MIGRAINES   Menorrhagia with irregular cycle 04/03/2014   PONV (postoperative nausea and vomiting)     Patient Active Problem List   Diagnosis Date Noted   Left lumbar radiculopathy 01/28/2019   Carpal tunnel syndrome on right 01/28/2019   Dyspareunia in female 09/08/2017   Abnormal uterine bleeding 09/08/2017   Menopausal syndrome 09/08/2017   Pain associated with defecation 09/08/2017   Uterine leiomyoma 09/08/2017   Mood disorder 12/13/2015   Inclusion cyst 12/13/2015   Headache 12/13/2015   Overweight (BMI 25.0-29.9) 12/13/2015   S/P laparoscopic assisted vaginal hysterectomy (LAVH) 08/01/2014   Menorrhagia with irregular cycle 04/03/2014    Past Surgical History:  Procedure Laterality Date   ABDOMINAL HYSTERECTOMY     CYSTOSCOPY N/A 08/01/2014   Procedure: CYSTOSCOPY;  Surgeon: Gigi Botts, MD;  Location: [facility];  Service: Gynecology;  Laterality: N/A;   LAPAROSCOPIC ASSISTED VAGINAL HYSTERECTOMY N/A 08/01/2014   Procedure: LAPAROSCOPIC ASSISTED VAGINAL HYSTERECTOMY;  Surgeon: Gigi Botts, MD;  Location: [facility];  Service: Gynecology;  Laterality: N/A;   LAPAROSCOPIC BILATERAL SALPINGO OOPHERECTOMY Bilateral 08/01/2014   Procedure: LAPAROSCOPIC BILATERAL SALPINGO OOPHORECTOMY;   Surgeon: Gigi Botts, MD;  Location: [facility];  Service: Gynecology;  Laterality: Bilateral;   LAPAROSCOPY N/A 05/12/2013   Procedure: LAPAROSCOPY DIAGNOSTIC, BIOPSY OF PERITONEUM, DRAINAGE OF RIGHT FOLLICULAR CYST;  Surgeon: Marie-Lyne Lavoie, MD;  Location: [facility];  Service: Gynecology;  Laterality: N/A;   TUBAL LIGATION      OB History   No obstetric history on file.      Home Medications    Prior to Admission medications   Medication Sig Start Date End Date Taking? Authorizing Provider  acetaminophen  (TYLENOL ) 500 MG tablet Take 500 mg by mouth every 6 (six) hours as needed.   Yes [provider]  ciprofloxacin-dexamethasone  (CIPRODEX) OTIC suspension Place 4 drops into the right ear 2 (two) times daily. 06/15/24  Yes Andra Corean BROCKS, PA-C  oseltamivir (TAMIFLU) 75 MG capsule Take 1 capsule (75 mg total) by mouth every 12 (twelve) hours. 06/15/24  Yes Andra Corean C, PA-C  promethazine -dextromethorphan (PROMETHAZINE -DM) 6.25-15 MG/5ML syrup Take 5 mLs by mouth. 01/01/21  Yes [provider]  azithromycin  (ZITHROMAX ) 250 MG tablet Take 1 tablet (250 mg total) by mouth daily. Take first 2 tablets together, then 1 every day until finished. 01/02/21   Caccavale, Sophia, PA-C  cetirizine (ZYRTEC) 10 MG tablet Take 10 mg by mouth daily.    [provider]  cholecalciferol (VITAMIN D3) 25 MCG (1000 UT) tablet Take 1,000 Units by mouth daily.    [provider]  cyclobenzaprine  (FLEXERIL ) 10 MG tablet Take 1 tablet (10 mg total) by mouth  3 (three) times daily as needed for muscle spasms. 12/31/18   Pauline Garnette LABOR, MD  Estradiol (VAGIFEM) 10 MCG TABS vaginal tablet Vagifem 10 mcg vaginal tablet  Insert 1 tablet(s) EVERY DAY by vaginal route for 14 days at bedtime then 2x/wk at bedtime    [provider]  lidocaine  (LIDODERM ) 5 % Place 1 patch onto the skin daily. Remove & Discard patch within 12 hours or as directed by MD 01/02/21   Caccavale,  Sophia, PA-C  meloxicam  (MOBIC ) 7.5 MG tablet Take 1 tablet (7.5 mg total) by mouth daily. 01/02/21   Caccavale, Sophia, PA-C  MULTIPLE VITAMIN PO Take by mouth.    [provider]  norethindrone  (AYGESTIN ) 5 MG tablet norethindrone  acetate 5 mg tablet    [provider]  traMADol  (ULTRAM ) 50 MG tablet Take 1 tablet (50 mg total) by mouth every 12 (twelve) hours as needed for moderate pain. Maximum 6 tabs per day. 06/30/19   Curtis Debby PARAS, MD    Family History Family History  Problem Relation Age of Onset   Thyroid disease Mother    Hyperlipidemia Mother    Heart disease Father    Thyroid disease Sister    Diabetes Sister    Hypertension Sister    Hypertension Brother    Stroke Brother    Diabetes Brother     Social History Social History   Tobacco Use   Smoking status: Every Day    Current packs/day: 1.00    Average packs/day: 1 pack/day for 20.0 years (20.0 ttl pk-yrs)    Types: Cigarettes    Passive exposure: Current   Smokeless tobacco: Never  Vaping Use   Vaping status: Every Day   Substances: Nicotine, Flavoring  Substance Use Topics   Alcohol use: Yes    Alcohol/week: 0.0 standard drinks of alcohol    Comment: occasionally   Drug use: No     Allergies   Codeine, Naproxen, and Tramadol    Review of Systems Review of Systems   Physical Exam Triage Vital Signs ED Triage Vitals  Encounter Vitals Group     BP 06/15/24 1545 133/80     Girls Systolic BP Percentile --      Girls Diastolic BP Percentile --      Boys Systolic BP Percentile --      Boys Diastolic BP Percentile --      Pulse Rate 06/15/24 1545 (!) 53     Resp 06/15/24 1545 16     Temp 06/15/24 1545 97.9 F (36.6 C)     Temp Source 06/15/24 1545 Oral     SpO2 06/15/24 1545 97 %     Weight 06/15/24 1544 145 lb (65.8 kg)     Height --      Head Circumference --      Peak Flow --      Pain Score 06/15/24 1543 5     Pain Loc --      Pain Education --      Exclude  from Growth Chart --    No data found.  Updated Vital Signs BP 133/80 (BP Location: Left Arm)   Pulse (!) 53   Temp 97.9 F (36.6 C) (Oral)   Resp 16   Wt 145 lb (65.8 kg)   LMP 04/27/2014   SpO2 97%   BMI 25.69 kg/m   Visual Acuity Right Eye Distance:   Left Eye Distance:   Bilateral Distance:    Right Eye Near:   Left  Eye Near:    Bilateral Near:     Physical Exam Vitals and nursing note reviewed.  Constitutional:      General: She is not in acute distress.    Appearance: Normal appearance. She is not ill-appearing, toxic-appearing or diaphoretic.  HENT:     Right Ear: Tenderness present.     Left Ear: Tympanic membrane, ear canal and external ear normal.     Ears:     Comments: Tenderness to palpation of right tragus and manipulation of right pinna    Nose: Congestion (mildly enlarged turbinates) present. No rhinorrhea.     Mouth/Throat:     Mouth: Mucous membranes are moist.     Pharynx: Oropharynx is clear. No oropharyngeal exudate or posterior oropharyngeal erythema.  Eyes:     General: No scleral icterus. Cardiovascular:     Rate and Rhythm: Normal rate and regular rhythm.     Heart sounds: Normal heart sounds.  Pulmonary:     Effort: Pulmonary effort is normal. No respiratory distress.     Breath sounds: Normal breath sounds. No wheezing or rhonchi.  Skin:    General: Skin is warm.  Neurological:     Mental Status: She is alert and oriented to person, place, and time.  Psychiatric:        Mood and Affect: Mood normal.        Behavior: Behavior normal.      UC Treatments / Results  Labs (all labs ordered are listed, but only abnormal results are displayed) Labs Reviewed  POC COVID19/FLU A&B COMBO - Abnormal; Notable for the following components:      Result Value   Influenza A Antigen, POC Positive (*)    All other components within normal limits    EKG   Radiology No results found.  Procedures Procedures (including critical care  time)  Medications Ordered in UC Medications - No data to display  Initial Impression / Assessment and Plan / UC Course  I have reviewed the triage vital signs and the nursing notes.  Pertinent labs & imaging results that were available during my care of the patient were reviewed by me and considered in my medical decision making (see chart for details).    Final Clinical Impressions(s) / UC Diagnoses   Final diagnoses:  Influenza A  Infective otitis externa of right ear     Discharge Instructions      Tamiflu works best if taken within the first 3 days of symptoms, you are on day 4. Taking it would not hurt but may not work as effectively as would have if taken within the first 3 days.  You been diagnosed with a viral illness today. -Viruses have to run their course and medicines that are prescribed are meant to help with symptoms. - With viruses usually feel poorly from 3 to 7 days with cough being the last symptoms to resolve.  -Cough can linger from days to weeks.  Antibiotics are not effective for viruses. -If your cough lasts more than 2 weeks and you are coughing so hard that you are vomiting or feel like you could pass out we need to follow-up with PCP for further testing and evaluation. -Rest, increase water  intake, may use pseudoephedrine for nasal congestion, Delsym (dextromethorphan) or honey as needed for cough, and ibuprofen  and/or Tylenol  as directed on packaging for pain and fever. -If you have hypertension you should take Coricidin or other OTC meds approved for people with high blood pressure. -You may  use a spoonful of honey every 4-6 hours as needed for throat pain and cough. -Warm tea with honey and lemon are helpful for soothe throat as well.  Chloraseptic and Cepacol make a throat lozenge with numbing medication, can be purchased over-the-counter. -May also use Flonase or sinus rinse for sinus pressure or nasal congestion.  Be sure to use distilled bottled  water  for sinus rinses. -May use coolmist humidifier to open up nasal passages -May elevate head to assist with postnasal drainage. -If you feel poorly (fever, fatigue, shortness of breath, nausea, etc.) for more than 10 days to be sure to follow-up with PCP or in clinic for further evaluation and additional treatments. If you experience chest pain with shortness of breath or pulse oxygen less than 95% you should report to the ER.     ED Prescriptions     Medication Sig Dispense Auth. Provider   oseltamivir  (TAMIFLU ) 75 MG capsule Take 1 capsule (75 mg total) by mouth every 12 (twelve) hours. 10 capsule Andra Krabbe C, PA-C   ciprofloxacin -dexamethasone  (CIPRODEX ) OTIC suspension Place 4 drops into the right ear 2 (two) times daily. 7.5 mL Andra Krabbe BROCKS, PA-C      PDMP not reviewed this encounter.   Andra Krabbe BROCKS, PA-C 06/15/24 1626
